# Patient Record
Sex: Female | Born: 1945 | Race: White | Hispanic: No | Marital: Married | Smoking: Former smoker
Health system: Southern US, Community
[De-identification: ages and names within clinical notes are randomized; demographics above are authoritative.]

## PROBLEM LIST (undated history)

## (undated) DIAGNOSIS — K219 Gastro-esophageal reflux disease without esophagitis: Secondary | ICD-10-CM

## (undated) DIAGNOSIS — C259 Malignant neoplasm of pancreas, unspecified: Secondary | ICD-10-CM

## (undated) DIAGNOSIS — Z856 Personal history of leukemia: Secondary | ICD-10-CM

## (undated) DIAGNOSIS — C9590 Leukemia, unspecified not having achieved remission: Secondary | ICD-10-CM

## (undated) DIAGNOSIS — K37 Unspecified appendicitis: Secondary | ICD-10-CM

## (undated) DIAGNOSIS — N390 Urinary tract infection, site not specified: Secondary | ICD-10-CM

## (undated) HISTORY — PX: FOOT SURGERY: SHX648

## (undated) HISTORY — PX: KNEE SURGERY: SHX244

## (undated) HISTORY — PX: HAND SURGERY: SHX662

## (undated) HISTORY — PX: APPENDECTOMY: SHX54

## (undated) HISTORY — DX: Personal history of leukemia: Z85.6

## (undated) HISTORY — DX: Gastro-esophageal reflux disease without esophagitis: K21.9

---

## 1998-06-03 ENCOUNTER — Other Ambulatory Visit: Admission: RE | Admit: 1998-06-03 | Discharge: 1998-06-03 | Payer: Self-pay | Admitting: Obstetrics and Gynecology

## 1998-12-23 ENCOUNTER — Encounter: Admission: RE | Admit: 1998-12-23 | Discharge: 1999-01-24 | Payer: Self-pay | Admitting: Family Medicine

## 1999-11-16 ENCOUNTER — Other Ambulatory Visit: Admission: RE | Admit: 1999-11-16 | Discharge: 1999-11-16 | Payer: Self-pay | Admitting: Obstetrics and Gynecology

## 1999-11-29 ENCOUNTER — Encounter: Admission: RE | Admit: 1999-11-29 | Discharge: 1999-11-29 | Payer: Self-pay | Admitting: Psychology

## 1999-11-29 ENCOUNTER — Encounter: Payer: Self-pay | Admitting: Obstetrics and Gynecology

## 2000-05-29 ENCOUNTER — Encounter: Admission: RE | Admit: 2000-05-29 | Discharge: 2000-05-29 | Payer: Self-pay | Admitting: Family Medicine

## 2000-05-29 ENCOUNTER — Encounter: Payer: Self-pay | Admitting: Family Medicine

## 2000-11-20 ENCOUNTER — Other Ambulatory Visit: Admission: RE | Admit: 2000-11-20 | Discharge: 2000-11-20 | Payer: Self-pay | Admitting: Obstetrics and Gynecology

## 2002-06-27 ENCOUNTER — Other Ambulatory Visit: Admission: RE | Admit: 2002-06-27 | Discharge: 2002-06-27 | Payer: Self-pay | Admitting: Obstetrics and Gynecology

## 2002-09-06 ENCOUNTER — Inpatient Hospital Stay (HOSPITAL_COMMUNITY): Admission: EM | Admit: 2002-09-06 | Discharge: 2002-09-08 | Payer: Self-pay | Admitting: *Deleted

## 2002-09-06 ENCOUNTER — Encounter: Payer: Self-pay | Admitting: Emergency Medicine

## 2002-09-07 ENCOUNTER — Encounter (INDEPENDENT_AMBULATORY_CARE_PROVIDER_SITE_OTHER): Payer: Self-pay

## 2002-11-27 ENCOUNTER — Encounter: Payer: Self-pay | Admitting: Family Medicine

## 2002-11-27 ENCOUNTER — Encounter: Admission: RE | Admit: 2002-11-27 | Discharge: 2002-11-27 | Payer: Self-pay | Admitting: Family Medicine

## 2003-07-22 ENCOUNTER — Other Ambulatory Visit: Admission: RE | Admit: 2003-07-22 | Discharge: 2003-07-22 | Payer: Self-pay | Admitting: Obstetrics and Gynecology

## 2004-03-04 ENCOUNTER — Encounter: Admission: RE | Admit: 2004-03-04 | Discharge: 2004-03-04 | Payer: Self-pay | Admitting: Family Medicine

## 2005-07-31 ENCOUNTER — Encounter: Admission: RE | Admit: 2005-07-31 | Discharge: 2005-07-31 | Payer: Self-pay | Admitting: Family Medicine

## 2008-07-06 ENCOUNTER — Encounter: Admission: RE | Admit: 2008-07-06 | Discharge: 2008-07-06 | Payer: Self-pay | Admitting: Orthopedic Surgery

## 2008-09-18 DIAGNOSIS — Z856 Personal history of leukemia: Secondary | ICD-10-CM

## 2008-09-18 HISTORY — DX: Personal history of leukemia: Z85.6

## 2009-10-19 ENCOUNTER — Encounter: Admission: RE | Admit: 2009-10-19 | Discharge: 2009-10-19 | Payer: Self-pay | Admitting: Internal Medicine

## 2010-03-30 ENCOUNTER — Encounter (INDEPENDENT_AMBULATORY_CARE_PROVIDER_SITE_OTHER): Payer: Self-pay | Admitting: *Deleted

## 2010-03-31 ENCOUNTER — Ambulatory Visit: Payer: Self-pay | Admitting: Gastroenterology

## 2010-04-04 ENCOUNTER — Telehealth: Payer: Self-pay | Admitting: Gastroenterology

## 2010-04-13 ENCOUNTER — Telehealth: Payer: Self-pay | Admitting: Gastroenterology

## 2010-04-14 ENCOUNTER — Ambulatory Visit: Payer: Self-pay | Admitting: Gastroenterology

## 2010-04-18 ENCOUNTER — Encounter: Payer: Self-pay | Admitting: Gastroenterology

## 2010-10-18 NOTE — Miscellaneous (Signed)
Summary: dir col ...em  Clinical Lists Changes  Medications: Added new medication of MOVIPREP 100 GM  SOLR (PEG-KCL-NACL-NASULF-NA ASC-C) As directed - Signed Rx of MOVIPREP 100 GM  SOLR (PEG-KCL-NACL-NASULF-NA ASC-C) As directed;  #1 x 0;  Signed;  Entered by: Clide Cliff RN;  Authorized by: Louis Meckel MD;  Method used: Electronically to [facility] [phone]*, [address] [place], [place], [place]  [address], Ph: [phone], Fax: [phone] Observations: Added new observation of ALLERGY REV: Done (03/31/2010 11:16)    Prescriptions: MOVIPREP 100 GM  SOLR (PEG-KCL-NACL-NASULF-NA ASC-C) As directed  #1 x 0   Entered by:   Clide Cliff RN   Authorized by:   Louis Meckel MD   Signed by:   Clide Cliff RN on 03/31/2010   Method used:   Electronically to        [facility] [phone]* (retail)       [address]       [place], [place]  [address]       Ph: [phone]       Fax: [phone]   RxID:   [id number]

## 2010-10-18 NOTE — Progress Notes (Signed)
Summary: prep ?  Phone Note Call from Patient Call back at [facility] Phone [phone]   Caller: Patient Call For: Dr. Arlyce Dice Reason for Call: Talk to Nurse Summary of Call: prep ? Initial call taken by: Vallarie Mare,  April 13, 2010 8:53 AM  Follow-up for Phone Call        Pt calling asking can she start her prep sooner than 5 pm and also can she have non dairy creamer........ Follow-up by: Joylene John RN,  April 13, 2010 9:58 AM  Additional Follow-up for Phone Call Additional follow up Details #1::        Pt instructed she may start her prep 3-4 pm but not sooner and she can have 1 use of non dairy creamer if necessary but no more because is cloudy and needs to avoid cloudy liquids. Pt verbalized understanding of instructions given Additional Follow-up by: Joylene John RN,  April 13, 2010 9:59 AM

## 2010-10-18 NOTE — Letter (Signed)
Summary: Patient Notice-Hyperplastic Polyps  [facility]  [address] [place], [place] [address]   Phone: [phone]  Fax: [phone]        April 18, 2010 MRN: [id number]    SONI KEGEL [address] [place], [place]  [address]    Dear Ms. Suhre,  I am pleased to inform you that the colon polyp(s) removed during your recent colonoscopy was (were) found to be hyperplastic.  These types of polyps are NOT pre-cancerous.  It is  my recommendation that you have a repeat colonoscopy examination in _5 years in view of your family history of colon cancer.  Should you develop new or worsening symptoms of abdominal pain, bowel habit changes or bleeding from the rectum or bowels, please schedule an evaluation with either your primary care physician or with me.  Additional information/recommendations:  __No further action with gastroenterology is needed at this time.      Please follow-up with your primary care physician for your other      healthcare needs. __Please call [phone] to schedule a return visit to review      your situation.  __Please keep your follow-up visit as already scheduled.  _x_Continue treatment plan as outlined the day of your exam.  Please call us if you are having persistent problems or have questions about your condition that have not been fully answered at this time.  Sincerely,  Louis Meckel MD This letter has been electronically signed by your physician.  Appended Document: Patient Notice-Hyperplastic Polyps Letter mailed 8.3.2011

## 2010-10-18 NOTE — Procedures (Signed)
Summary: Colonoscopy  Patient: Sharon Newton Note: All result statuses are Final unless otherwise noted.  Tests: (1) Colonoscopy (COL)   COL Colonoscopy           DONE     [facility]     [address]     [place], [place]  [address]           COLONOSCOPY PROCEDURE REPORT           PATIENT:  Sharon Newton, Sharon Newton  MR#:  [id number]     BIRTHDATE:  [birth year], 64 yrs. old  GENDER:  female           ENDOSCOPIST:  Barbette Hair. Arlyce Dice, MD     Referred by:           PROCEDURE DATE:  04/14/2010     PROCEDURE:  Diagnostic Colonoscopy     ASA CLASS:  Class II     INDICATIONS:  1) Elevated Risk Screening  2) family history of     colon cancer Father, brother, paternal grandfather           MEDICATIONS:   Fentanyl 50 mcg IV, Versed 7 mg IV           DESCRIPTION OF PROCEDURE:   After the risks benefits and     alternatives of the procedure were thoroughly explained, informed     consent was obtained.  Digital rectal exam was performed and     revealed moderate external hemorrhoids.   The LB CF-H180AL [id number]     endoscope was introduced through the anus and advanced to the     cecum, which was identified by both the appendix and ileocecal     valve, without limitations.  The quality of the prep was     excellent, using MoviPrep.  The instrument was then slowly     withdrawn as the colon was fully examined.     <<PROCEDUREIMAGES>>           FINDINGS:  There were multiple polyps identified and removed. in     the rectum and sigmoid colon. Multiple 1-58mm sessile, hyperplastic     appearing polyps within 20cm of anus. 1 polyp was removed with     cold snare and a second with cold biopsy forceps (see image14).     Mild diverticulosis was found in the sigmoid colon (see image1).     Internal and external hemorrhoids were found (see image15).  This     was otherwise a normal examination of the colon (see image2,     image3, image5, image6, image8, image10, and image12).     Retroflexed views in  the rectum revealed no abnormalities.    The     time to cecum =  8.0  minutes. The scope was then withdrawn (time     =  7.5  min) from the patient and the procedure completed.           COMPLICATIONS:  None           ENDOSCOPIC IMPRESSION:     1) Polyps, multiple hyperplastic appearing in the rectum and     sigmoid colon     2) Mild diverticulosis in the sigmoid colon     3) Internal and external hemorrhoids     4) Otherwise normal examination     RECOMMENDATIONS:     1) Given your significant family history of colon cancer, you     should have a repeat colonoscopy  in 5 years           REPEAT EXAM:  In 5 year(s) for Colonoscopy.           ______________________________     Barbette Hair. Arlyce Dice, MD           CC: Orvan July MD           n.     Rosalie Doctor:   Barbette Hair. Kruz Chiu at 04/14/2010 08:50 AM           Page 2 of 3   [place], [place], [id number]  Note: An exclamation mark (!) indicates a result that was not dispersed into the flowsheet. Document Creation Date: 04/14/2010 8:52 AM _______________________________________________________________________  (1) Order result status: Final Collection or observation date-time: 04/14/2010 08:40 Requested date-time:  Receipt date-time:  Reported date-time:  Referring Physician:   Ordering Physician: Melvia Heaps [phone]) Specimen Source:  Source: Launa Grill Order Number: [id number] Lab site:   Appended Document: Colonoscopy     Procedures Next Due Date:    Colonoscopy: 03/2015

## 2010-10-18 NOTE — Progress Notes (Signed)
Summary: prep ?'s  Phone Note Call from Patient Call back at [facility] Phone [phone]   Caller: Patient Call For: Dr. Arlyce Dice Reason for Call: Talk to Nurse Summary of Call: prep ?'s Initial call taken by: Vallarie Mare,  April 04, 2010 3:42 PM  Follow-up for Phone Call        Pt moved appt. time to 8:00; prep times verified and adjusted as needed.  No further questions Follow-up by: Karl Bales RN,  April 04, 2010 5:20 PM

## 2010-10-18 NOTE — Letter (Signed)
Summary: [facility] Instructions  [facility]  [address] [place], [place] [address]   Phone: [phone]  Fax: [phone]       Sharon Newton    11-06-1945    MRN: [id number]        Procedure Day Dorna Bloom:  Lenor Coffin  04/14/10     Arrival Time:  10:00AM      Procedure Time:  11:00AM     Location of Procedure:                    _X _  [facility] (4th Floor)                       PREPARATION FOR COLONOSCOPY WITH MOVIPREP   Starting 5 days prior to your procedure 04/09/10 do not eat nuts, seeds, popcorn, corn, beans, peas,  salads, or any raw vegetables.  Do not take any fiber supplements (e.g. Metamucil, Citrucel, and Benefiber).  THE DAY BEFORE YOUR PROCEDURE         DATE: 04/13/10  DAY: WEDNESDAY  1.  Drink clear liquids the entire day-NO SOLID FOOD  2.  Do not drink anything colored red or purple.  Avoid juices with pulp.  No orange juice.  3.  Drink at least 64 oz. (8 glasses) of fluid/clear liquids during the day to prevent dehydration and help the prep work efficiently.  CLEAR LIQUIDS INCLUDE: Water Jello Ice Popsicles Tea (sugar ok, no milk/cream) Powdered fruit flavored drinks Coffee (sugar ok, no milk/cream) Gatorade Juice: apple, white grape, white cranberry  Lemonade Clear bullion, consomm, broth Carbonated beverages (any kind) Strained chicken noodle soup Hard Candy                             4.  In the morning, mix first dose of MoviPrep solution:    Empty 1 Pouch A and 1 Pouch B into the disposable container    Add lukewarm drinking water to the top line of the container. Mix to dissolve    Refrigerate (mixed solution should be used within 24 hrs)  5.  Begin drinking the prep at 5:00 p.m. The MoviPrep container is divided by 4 marks.   Every 15 minutes drink the solution down to the next mark (approximately 8 oz) until the full liter is complete.   6.  Follow completed prep with 16 oz of clear liquid of your choice  (Nothing red or purple).  Continue to drink clear liquids until bedtime.  7.  Before going to bed, mix second dose of MoviPrep solution:    Empty 1 Pouch A and 1 Pouch B into the disposable container    Add lukewarm drinking water to the top line of the container. Mix to dissolve    Refrigerate  THE DAY OF YOUR PROCEDURE      DATE: 04/14/10  DAY: THURSDAY  Beginning at 6:00AM (5 hours before procedure):         1. Every 15 minutes, drink the solution down to the next mark (approx 8 oz) until the full liter is complete.  2. Follow completed prep with 16 oz. of clear liquid of your choice.    3. You may drink clear liquids until 9:00AM (2 HOURS BEFORE PROCEDURE).   MEDICATION INSTRUCTIONS  Unless otherwise instructed, you should take regular prescription medications with a small sip of water   as early as possible the morning  of your procedure.         OTHER INSTRUCTIONS  You will need a responsible adult at least 63 years of age to accompany you and drive you home.   This person must remain in the waiting room during your procedure.  Wear loose fitting clothing that is easily removed.  Leave jewelry and other valuables at home.  However, you may wish to bring a book to read or  an iPod/MP3 player to listen to music as you wait for your procedure to start.  Remove all body piercing jewelry and leave at home.  Total time from sign-in until discharge is approximately 2-3 hours.  You should go home directly after your procedure and rest.  You can resume normal activities the  day after your procedure.  The day of your procedure you should not:   Drive   Make legal decisions   Operate machinery   Drink alcohol   Return to work  You will receive specific instructions about eating, activities and medications before you leave.    The above instructions have been reviewed and explained to me by   Clide Cliff, RN_______________________    I fully understand  and can verbalize these instructions _____________________________ Date _________

## 2010-11-04 ENCOUNTER — Other Ambulatory Visit: Payer: Self-pay | Admitting: *Deleted

## 2010-11-04 DIAGNOSIS — Z1231 Encounter for screening mammogram for malignant neoplasm of breast: Secondary | ICD-10-CM

## 2010-11-16 ENCOUNTER — Ambulatory Visit
Admission: RE | Admit: 2010-11-16 | Discharge: 2010-11-16 | Disposition: A | Discharge status: Home / Self Care | Payer: BC Managed Care – PPO | Source: Ambulatory Visit | Attending: *Deleted | Admitting: *Deleted

## 2010-11-16 DIAGNOSIS — Z1231 Encounter for screening mammogram for malignant neoplasm of breast: Secondary | ICD-10-CM

## 2011-02-03 NOTE — Op Note (Signed)
NAME:  Sharon Newton, Sharon Newton                           ACCOUNT NO.:  [id number]   MEDICAL RECORD NO.:  [id number]                   PATIENT TYPE:  INP   LOCATION:  0453                                 FACILITY:  [facility]   PHYSICIAN:  Lorre Munroe., M.D.            DATE OF BIRTH:  18-Nov-1945   DATE OF PROCEDURE:  09/07/2002  DATE OF DISCHARGE:                                 OPERATIVE REPORT   PREOPERATIVE DIAGNOSES:  Acute appendicitis.   POSTOPERATIVE DIAGNOSES:  Acute appendicitis.   OPERATION PERFORMED:  Laparoscopic appendectomy.   SURGEON:  Lebron Conners, M.D.   ANESTHESIA:  General.   PROCEDURE:  After the patient was monitored and anesthetized and had routine  preparation and draping of the abdomen and a Foley catheter inserted, I  anesthestized three port sites, one in the hypogastrium, one just below the  umbilicus, and one in the right upper quadrant.  I made a short  infraumbilical incision and opened the fascia longitudinally and opened the  peritoneum bluntly and then made sure there were no adhesions to the  anterior abdominal wall in that region and secured with Hasson cannula with  a pursestring 0 Vicryl suture in the fascia.  After inflating the abdomen  with CO2, I put in two additional ports under direct vision with a large one  in the lower midline and a 5 mm port in the right upper quadrant.  I then  retracted the cecum up and could see that there was evidence of acute  inflammation at the tip and medial to it.  I bluntly dissected this up and  discovered an acutely inflamed appendix with what appeared to be perhaps a  small gangrenous portion in the midportion of it, but it was not ruptured.  I grasped it with a large grasper and elevated it and dissected through the  mesoappendix, gathering hemostasis with the cautery and thoroughly  cauterizing the appendiceal artery.  I dissected until I got right down to  the cecum, and there was healthy-appearing  appendix adjacent to the cecum.  I then stapled across it with the endoscopic cutting stapler, and it came  off quite nicely with one firing of the stapler and appeared to be quite  secure.  I then irrigated the operative area and removed irrigant and  assured that hemostasis was good.  I removed the appendix from the body in a  plastic pouch and then tied the pursestring suture.  I again checked for  hemostasis and then removed the lateral port under direct vision and then  removed from the lower midline port after first evacuating the CO2.  I  closed all skin incisions with intracuticular 4-0 Vicryl and Steri-Strips.  The patient tolerated the operation well.  Lorre Munroe., M.D.   WB/MEDQ  D:  09/07/2002  T:  09/08/2002  Job:  [id number]   cc:   Brett Canales A. Cleta Alberts, M.D.  [address]  [place]  [place] [address]  Fax: [phone]

## 2011-08-02 ENCOUNTER — Encounter (INDEPENDENT_AMBULATORY_CARE_PROVIDER_SITE_OTHER): Payer: BC Managed Care – PPO | Admitting: Surgery

## 2011-08-04 ENCOUNTER — Ambulatory Visit (INDEPENDENT_AMBULATORY_CARE_PROVIDER_SITE_OTHER): Payer: BC Managed Care – PPO | Admitting: Surgery

## 2011-08-17 ENCOUNTER — Encounter (INDEPENDENT_AMBULATORY_CARE_PROVIDER_SITE_OTHER): Payer: BC Managed Care – PPO | Admitting: Surgery

## 2011-08-21 ENCOUNTER — Other Ambulatory Visit: Payer: Self-pay | Admitting: Orthopaedic Surgery

## 2011-08-21 DIAGNOSIS — M79669 Pain in unspecified lower leg: Secondary | ICD-10-CM

## 2011-08-22 ENCOUNTER — Ambulatory Visit
Admission: RE | Admit: 2011-08-22 | Discharge: 2011-08-22 | Disposition: A | Discharge status: Home / Self Care | Payer: BC Managed Care – PPO | Source: Ambulatory Visit | Attending: Orthopaedic Surgery | Admitting: Orthopaedic Surgery

## 2011-08-22 ENCOUNTER — Other Ambulatory Visit: Payer: BC Managed Care – PPO

## 2011-08-22 DIAGNOSIS — M79669 Pain in unspecified lower leg: Secondary | ICD-10-CM

## 2011-09-26 ENCOUNTER — Ambulatory Visit: Payer: BC Managed Care – PPO | Attending: Orthopaedic Surgery | Admitting: Physical Therapy

## 2011-09-26 DIAGNOSIS — M25569 Pain in unspecified knee: Secondary | ICD-10-CM | POA: Insufficient documentation

## 2011-09-26 DIAGNOSIS — IMO0001 Reserved for inherently not codable concepts without codable children: Secondary | ICD-10-CM | POA: Insufficient documentation

## 2011-10-11 ENCOUNTER — Other Ambulatory Visit: Payer: Self-pay | Admitting: Internal Medicine

## 2011-10-11 DIAGNOSIS — Z1231 Encounter for screening mammogram for malignant neoplasm of breast: Secondary | ICD-10-CM

## 2011-11-17 ENCOUNTER — Ambulatory Visit
Admission: RE | Admit: 2011-11-17 | Discharge: 2011-11-17 | Disposition: A | Discharge status: Home / Self Care | Payer: BC Managed Care – PPO | Source: Ambulatory Visit | Attending: Internal Medicine | Admitting: Internal Medicine

## 2011-11-17 DIAGNOSIS — Z1231 Encounter for screening mammogram for malignant neoplasm of breast: Secondary | ICD-10-CM

## 2013-01-20 ENCOUNTER — Other Ambulatory Visit: Payer: Self-pay

## 2013-01-20 DIAGNOSIS — Z1231 Encounter for screening mammogram for malignant neoplasm of breast: Secondary | ICD-10-CM

## 2013-02-26 ENCOUNTER — Ambulatory Visit
Admission: RE | Admit: 2013-02-26 | Discharge: 2013-02-26 | Disposition: A | Discharge status: Home / Self Care | Payer: Medicare Other | Source: Ambulatory Visit

## 2013-02-26 DIAGNOSIS — Z1231 Encounter for screening mammogram for malignant neoplasm of breast: Secondary | ICD-10-CM

## 2014-02-03 ENCOUNTER — Encounter: Payer: Self-pay | Admitting: Podiatry

## 2014-02-03 ENCOUNTER — Ambulatory Visit (INDEPENDENT_AMBULATORY_CARE_PROVIDER_SITE_OTHER): Payer: Medicare Other | Admitting: Podiatry

## 2014-02-03 ENCOUNTER — Ambulatory Visit (INDEPENDENT_AMBULATORY_CARE_PROVIDER_SITE_OTHER): Payer: Medicare Other

## 2014-02-03 VITALS — BP 114/63 | HR 75 | Resp 16 | Ht 63.0 in | Wt 131.0 lb

## 2014-02-03 DIAGNOSIS — M21619 Bunion of unspecified foot: Secondary | ICD-10-CM

## 2014-02-03 DIAGNOSIS — M2020 Hallux rigidus, unspecified foot: Secondary | ICD-10-CM

## 2014-02-03 NOTE — Patient Instructions (Signed)
Pre-Operative Instructions  Congratulations, you have decided to take an important step to improving your quality of life.  You can be assured that the doctors of [facility] will be with you every step of the way.  1. Plan to be at the surgery center/hospital at least 1 (one) hour prior to your scheduled time unless otherwise directed by the surgical center/hospital staff.  You must have a responsible adult accompany you, remain during the surgery and drive you home.  Make sure you have directions to the surgical center/hospital and know how to get there on time. 2. For hospital based surgery you will need to obtain a history and physical form from your family physician within 1 month prior to the date of surgery- we will give you a form for you primary physician.  3. We make every effort to accommodate the date you request for surgery.  There are however, times where surgery dates or times have to be moved.  We will contact you as soon as possible if a change in schedule is required.   4. No Aspirin/Ibuprofen for one week before surgery.  If you are on aspirin, any non-steroidal anti-inflammatory medications (Mobic, Aleve, Ibuprofen) you should stop taking it 7 days prior to your surgery.  You make take Tylenol  For pain prior to surgery.  5. Medications- If you are taking daily heart and blood pressure medications, seizure, reflux, allergy, asthma, anxiety, pain or diabetes medications, make sure the surgery center/hospital is aware before the day of surgery so they may notify you which medications to take or avoid the day of surgery. 6. No food or drink after midnight the night before surgery unless directed otherwise by surgical center/hospital staff. 7. No alcoholic beverages 24 hours prior to surgery.  No smoking 24 hours prior to or 24 hours after surgery. 8. Wear loose pants or shorts- loose enough to fit over bandages, boots, and casts. 9. No slip on shoes, sneakers are best. 10. Bring  your boot with you to the surgery center/hospital.  Also bring crutches or a walker if your physician has prescribed it for you.  If you do not have this equipment, it will be provided for you after surgery. 11. If you have not been contracted by the surgery center/hospital by the day before your surgery, call to confirm the date and time of your surgery. 12. Leave-time from work may vary depending on the type of surgery you have.  Appropriate arrangements should be made prior to surgery with your employer. 13. Prescriptions will be provided immediately following surgery by your doctor.  Have these filled as soon as possible after surgery and take the medication as directed. 14. Remove nail polish on the operative foot. 15. Wash the night before surgery.  The night before surgery wash the foot and leg well with the antibacterial soap provided and water paying special attention to beneath the toenails and in between the toes.  Rinse thoroughly with water and dry well with a towel.  Perform this wash unless told not to do so by your physician.  Enclosed: 1 Ice pack (please put in freezer the night before surgery)   1 Hibiclens skin cleaner   Pre-op Instructions  If you have any questions regarding the instructions, do not hesitate to call our office.  [place]: [address] [place], [place] [address] [phone]  [place]: [address] [place], [place] [address] [phone]  [place]: [address]  , [place] [address] [phone]  Dr. Richard   Tuchman DPM, Dr. Norman Regal DPM Dr. Richard Sikora DPM, Dr. M. Todd Hyatt DPM, Dr. Kathryn Egerton DPM 

## 2014-02-03 NOTE — Progress Notes (Signed)
   Subjective:    Patient ID: Sharon Newton, female    DOB: [birth year], 68 y.o.   MRN: [id number]  HPI Comments: This right foot , it never has stopped hurting since surgery. Its sharp, throbbing , aching, sore . i just started to get the feeling back in it this year. It hurts at anytime , wearing flat shoes i am in a lot of trouble   Foot Pain      Review of Systems  All other systems reviewed and are negative.      Objective:   Physical Exam: I have reviewed her past medical history medications allergies surgeries social history and review of systems. Pulses are strongly palpable bilateral. Deep tendon reflexes are intact bilateral neurologic sensorium is intact per since once the monofilament. Muscle strength is 5 over 5 dorsiflexors plantar flexors inverters everters all intrinsic musculature appears to be intact orthopedic evaluation demonstrates all joints distal to the ankle a full range of motion with the exception of the first metatarsophalangeal joint of the right foot. This joint is status post bunion correction with Akin osteotomy which when on to degenerative joint disease postoperatively in a slightly dorsiflexed position resulting in chronic sesamoiditis and pain about the first metatarsophalangeal joint. She has no motion at the first metatarsophalangeal joint of the right foot. She has pain on palpation of the fibular sesamoid right first metatarsophalangeal joint plantarly. Radiographic evaluation confirms a rectus hallux with severe osteoarthritis and loss of joint space. Lateral view does demonstrate dorsiflexion of the first metatarsophalangeal joint. Cutaneous evaluation demonstrates supple well hydrated cutis without erythema edema cellulitis drainage or odor.        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 This is status post Austin bunion repair with an Akin  osteotomy.  Plan: We discussed the etiology pathology conservative versus surgical therapies. We discussed in great detail today the pros and cons of 2 procedures: #1 a fusion of the first metatarsophalangeal joint of the right foot, #2 Keller arthroplasty with single silicone implant. I answered all the questions regarding these procedures the best of my ability in layman's terms. I expressed to her that in under no uncertain terms that this may decrease or eliminate her pain. I made no promises that she would be pain free nor did I made any promises that this would even help at this point in time. She understands that she may need further surgery loss of sensation also digit or loss of life. She went over consent form line bylined number by number giving her ample time to ask questions she saw fit. These questions were answered by myself or our nursing staff. I will followup with her at the end of June for surgical procedure. Currently we are planning Keller arthroplasty with single silicone implant.

## 2014-03-05 ENCOUNTER — Other Ambulatory Visit: Payer: Self-pay | Admitting: Podiatry

## 2014-03-05 MED ORDER — CEPHALEXIN 500 MG PO CAPS: 500.0000 mg | ORAL_CAPSULE | Freq: Three times a day (TID) | ORAL | Status: DC

## 2014-03-05 MED ORDER — PROMETHAZINE HCL 12.5 MG PO TABS: 25.0000 mg | ORAL_TABLET | Freq: Four times a day (QID) | ORAL | Status: AC | PRN

## 2014-03-05 MED ORDER — MEPERIDINE HCL 50 MG PO TABS: ORAL_TABLET | ORAL | Status: DC

## 2014-03-06 ENCOUNTER — Telehealth: Payer: Self-pay | Admitting: *Deleted

## 2014-03-06 ENCOUNTER — Other Ambulatory Visit: Payer: Self-pay | Admitting: Podiatrist

## 2014-03-06 DIAGNOSIS — M2020 Hallux rigidus, unspecified foot: Secondary | ICD-10-CM

## 2014-03-06 NOTE — Telephone Encounter (Signed)
My wife was in this morning, had foot surgery.  The doctor wrote a prescription for Demerol.  The insurance company says no.  I assume he has to call them first.  When this shot wears off she's going to have pain.  We need this pretty quickly.

## 2014-03-06 NOTE — Telephone Encounter (Signed)
Per Dr. Valentina Lucks, I called and asked the husband if patient can tolerate Dilaudid or Hydrocodone.  He stated he went ahead and paid for the medicine.  He said he doesn't think it makes since to have to wait 72 hours to get the medicine when the patient needs it.  What do you think?  I told him I agree.  He said to thank Dr. Valentina Lucks for trying to take care of this.

## 2014-03-09 ENCOUNTER — Telehealth: Payer: Self-pay | Admitting: *Deleted

## 2014-03-09 NOTE — Telephone Encounter (Signed)
Just had foot surgery this morning.  He told my husband to bend my toe.  Dr. Milinda Pointer didn't tell him when to start.  My husband wants to start it immediately!  Help please!  I left her a message to go ahead and start the toe exercises.  Call if you have any further questions.

## 2014-03-10 ENCOUNTER — Telehealth: Payer: Self-pay

## 2014-03-10 NOTE — Telephone Encounter (Signed)
Spoke with pt regarding post op status. She stated that she is doing well and pain is very minimal. Advised her to remain in boot and sterile dressing until seen at her appt. Advised to elevate and take pain medications as needed.

## 2014-03-12 ENCOUNTER — Ambulatory Visit (INDEPENDENT_AMBULATORY_CARE_PROVIDER_SITE_OTHER): Payer: Medicare Other | Admitting: Podiatry

## 2014-03-12 ENCOUNTER — Ambulatory Visit (INDEPENDENT_AMBULATORY_CARE_PROVIDER_SITE_OTHER): Payer: Medicare Other

## 2014-03-12 ENCOUNTER — Encounter: Payer: Self-pay | Admitting: Podiatry

## 2014-03-12 VITALS — BP 125/97 | HR 76 | Temp 97.9°F | Resp 16

## 2014-03-12 DIAGNOSIS — M21619 Bunion of unspecified foot: Secondary | ICD-10-CM

## 2014-03-12 DIAGNOSIS — M21611 Bunion of right foot: Secondary | ICD-10-CM

## 2014-03-12 DIAGNOSIS — Z9889 Other specified postprocedural states: Secondary | ICD-10-CM

## 2014-03-12 MED ORDER — HYDROCODONE-ACETAMINOPHEN 5-325 MG PO TABS: ORAL_TABLET | ORAL | Status: DC

## 2014-03-12 NOTE — Progress Notes (Signed)
She presents today one week status post arthroplasty Keller arthroplasty to the first metatarsophalangeal joint of the right foot. She denies fever chills nausea vomiting muscle aches or pains.  Objective: Vital signs are stable she is alert and oriented x3 dry sterile dressing was removed demonstrates erythema and edema with him some blistering along the incision site but appears to be healing quite nicely she's got a good range of motion first metatarsophalangeal joint and radiographs confirm good position of the implant.  Assessment: Well-healing surgical foot status post 1 week and arthroplasty right foot.  Plan: Redressed today dressed a compressive dressing followup with her in one week.

## 2014-03-16 ENCOUNTER — Telehealth: Payer: Self-pay | Admitting: *Deleted

## 2014-03-16 NOTE — Telephone Encounter (Signed)
Had foot surgery on the 19th.  I came in last Thursday and had bandage changed.  i took my boot off on Sunday, the entire bandage packet came off.  Do you guys need to put a new bandage on it or trust me to do it?  I put one back on.  I called and informed her it should be okay since she has redressed it.  We will see her on Thursday for scheduled follow-up appointment.

## 2014-03-19 ENCOUNTER — Encounter: Payer: Self-pay | Admitting: Podiatry

## 2014-03-19 ENCOUNTER — Ambulatory Visit (INDEPENDENT_AMBULATORY_CARE_PROVIDER_SITE_OTHER): Payer: Medicare Other | Admitting: Podiatry

## 2014-03-19 VITALS — BP 122/68 | HR 84 | Resp 12

## 2014-03-19 DIAGNOSIS — Z9889 Other specified postprocedural states: Secondary | ICD-10-CM

## 2014-03-19 MED ORDER — CEPHALEXIN 500 MG PO CAPS: 500.0000 mg | ORAL_CAPSULE | Freq: Three times a day (TID) | ORAL | Status: DC

## 2014-03-19 NOTE — Progress Notes (Signed)
She presents today for her second week postop visit right foot. She denies fever chills nausea vomiting muscle aches and pains states it seems to be doing pretty well. She states she's been doing her exercises on a regular basis.  Objective: Dry sterile dressing was removed demonstrates some drainage on the dressing from the incision site. She still has some erythema and some friable tissue at the incision site. This is secondary to blistering and ulceration. This does not appear to be clinically infected at this time however I think another round of antibiotics may be necessary.  Assessment: Well-healing first metatarsophalangeal joint arthroplasty with single silicone implant . Some mild skin breakdown is present.  Plan: I encouraged range of motion exercises today I redressed the foot with a dry sterile compressive dressing and Betadine soaked gauze. I will followup with her in one week at which time I hope to start soaking in Epsom salts in warm water. I did dispense a Darco shoe today.

## 2014-03-26 ENCOUNTER — Ambulatory Visit (INDEPENDENT_AMBULATORY_CARE_PROVIDER_SITE_OTHER): Payer: Medicare Other | Admitting: Podiatry

## 2014-03-26 ENCOUNTER — Encounter: Payer: Self-pay | Admitting: Podiatry

## 2014-03-26 VITALS — BP 114/63 | HR 75 | Resp 16

## 2014-03-26 DIAGNOSIS — Z9889 Other specified postprocedural states: Secondary | ICD-10-CM

## 2014-03-26 NOTE — Progress Notes (Signed)
She presents today 3 weeks status post Keller arthroplasty with single silicone implant right foot. Over the past couple of weeks the wound has been somewhat boggy and wet on the top of the incision site. Since she was placed in a Darco shoe she states that the joint is become more sore. She denies fever chills nausea vomiting muscle aches and pains.  Objective: Vital signs are stable she is alert and oriented x3. The toe is stiff and painful on range of motion of the first metatarsophalangeal joint right foot more than likely secondary to the edema and superficial cellulitis that she has overlying the proximal phalanx of the hallux. There is mild dehiscence of the wound but is very superficial and does not probe to subcutaneous tissue.  Assessment: Mild postop infection about the first metatarsophalangeal joint of the right foot status post Keller arthroplasty single silicone implant. No systemic symptoms noted.  Plan: Discussed etiology pathology conservative versus surgical therapies. I encouraged her to use her Darco shoe. I encouraged range of motion exercises both active and passive range of motion. We started her on an oral antibiotic today and she will start soaking in Epsom salts and water after washing with an antibacterial soap. She'll apply a light dressing to the area to prevent drainage onto her shoe and debris in the dehisced surgical site. I will followup with her in one week.

## 2014-04-02 ENCOUNTER — Encounter: Payer: Self-pay | Admitting: Podiatry

## 2014-04-02 ENCOUNTER — Ambulatory Visit (INDEPENDENT_AMBULATORY_CARE_PROVIDER_SITE_OTHER): Payer: Medicare Other | Admitting: Podiatry

## 2014-04-02 ENCOUNTER — Ambulatory Visit (INDEPENDENT_AMBULATORY_CARE_PROVIDER_SITE_OTHER): Payer: Medicare Other

## 2014-04-02 DIAGNOSIS — M21611 Bunion of right foot: Secondary | ICD-10-CM

## 2014-04-02 DIAGNOSIS — Z9889 Other specified postprocedural states: Secondary | ICD-10-CM

## 2014-04-02 DIAGNOSIS — M21619 Bunion of unspecified foot: Secondary | ICD-10-CM

## 2014-04-02 MED ORDER — HYDROCODONE-ACETAMINOPHEN 5-325 MG PO TABS: ORAL_TABLET | ORAL | Status: DC

## 2014-04-02 NOTE — Progress Notes (Signed)
Sharon Newton presents today approximately one month status post Sharon Newton arthroplasty and single silicone implant right foot. We have had some delays with dehiscence of the wound and swelling. Currently she denies fever chills nausea vomiting muscle aches or pains other than pain at night while she trying to sleep. She and her husband are meticulously exercising the first metatarsophalangeal joint as I have requested. She states it is not and hurt nearly as bad as it has in the past.   Objective: Vital signs are stable she is alert and oriented x3. She has a very small superficial ulcerative lesion near the knot of the distal most portion of the incision dorsally. This does not appear to be clinically infected. She has a very good range of motion of the first metatarsophalangeal joint. Radiographically however it does demonstrate considerable soft tissue swelling which is actually scar tissue around the first metatarsophalangeal joint right foot.  Assessment: Well-healing surgical foot with the exception of a superficial wound to the dorsal aspect of the hallux.  Plan: Discussed etiology pathology conservative versus surgical therapies. We are going to continue current therapies for the wound. And she will continue massage therapy as well as range of motion exercises for that right foot. By the next him I see her we should be able to be into a pair of loosefitting tennis shoes or a mule type shoe. Followup with her in 2-4 weeks.

## 2014-04-16 ENCOUNTER — Ambulatory Visit (INDEPENDENT_AMBULATORY_CARE_PROVIDER_SITE_OTHER): Payer: Medicare Other

## 2014-04-16 ENCOUNTER — Encounter: Payer: Self-pay | Admitting: Podiatry

## 2014-04-16 ENCOUNTER — Ambulatory Visit (INDEPENDENT_AMBULATORY_CARE_PROVIDER_SITE_OTHER): Payer: Medicare Other | Admitting: Podiatry

## 2014-04-16 DIAGNOSIS — M21611 Bunion of right foot: Secondary | ICD-10-CM

## 2014-04-16 DIAGNOSIS — M21619 Bunion of unspecified foot: Secondary | ICD-10-CM

## 2014-04-16 DIAGNOSIS — Z9889 Other specified postprocedural states: Secondary | ICD-10-CM

## 2014-04-16 NOTE — Progress Notes (Signed)
She presents today more than 1 month status post Keller arthroplasty and single silicone implant. She states it seems to be doing pretty well. She denies fever chills nausea vomiting muscle aches and pains.  Objective: Vital signs are stable she is alert and oriented x3. Pulses remain palpable right foot. She has a acceptable range of motion to the first metatarsophalangeal joint of the right foot. The skin is still drawn tight with scar tissue and edema. Radiographic evaluation demonstrates a well-placed Keller arthroplasty and single silicone implant. No signs of cutaneous infection. She still has a very mild superficial wound to the distal most incision site. This does not appear to be clinically infected.  Assessment: Well-healing surgical foot right.  Plan: Increase range of motion exercises and get back to regular shoe gear she will continue to soak her foot daily and apply a dressing to the toe during the day and leave open at night.

## 2014-05-14 ENCOUNTER — Ambulatory Visit (INDEPENDENT_AMBULATORY_CARE_PROVIDER_SITE_OTHER): Payer: Medicare Other | Admitting: Podiatry

## 2014-05-14 ENCOUNTER — Ambulatory Visit (INDEPENDENT_AMBULATORY_CARE_PROVIDER_SITE_OTHER): Payer: Medicare Other

## 2014-05-14 ENCOUNTER — Encounter: Payer: Self-pay | Admitting: Podiatry

## 2014-05-14 VITALS — BP 124/69 | HR 69 | Resp 12

## 2014-05-14 DIAGNOSIS — M2011 Hallux valgus (acquired), right foot: Secondary | ICD-10-CM

## 2014-05-14 DIAGNOSIS — M2010 Hallux valgus (acquired), unspecified foot: Secondary | ICD-10-CM

## 2014-05-14 DIAGNOSIS — Z9889 Other specified postprocedural states: Secondary | ICD-10-CM

## 2014-05-14 NOTE — Progress Notes (Signed)
She presents today for followup of her Sharon Newton arthroplasty first metatarsophalangeal joint of the right foot. She states it seems to be doing much better.  Objective: Mild erythema about the hallux right. Mild postinflammatory hyperpigmentation. Mild edema. Great range of motion of the first metatarsophalangeal joint no pain on range of motion. Radiographic evaluation shows well-healing surgical foot.  Assessment: Well-healing Keller arthroplasty with single silicone implant right foot.  Plan: She may get back to her regular activities and I will followup with her in 2 months.

## 2014-06-01 ENCOUNTER — Other Ambulatory Visit: Payer: Self-pay | Admitting: Dermatology

## 2014-07-14 ENCOUNTER — Ambulatory Visit (INDEPENDENT_AMBULATORY_CARE_PROVIDER_SITE_OTHER): Payer: Medicare Other

## 2014-07-14 ENCOUNTER — Ambulatory Visit (INDEPENDENT_AMBULATORY_CARE_PROVIDER_SITE_OTHER): Payer: Medicare Other | Admitting: Podiatry

## 2014-07-14 ENCOUNTER — Encounter: Payer: Self-pay | Admitting: Podiatry

## 2014-07-14 VITALS — BP 116/58 | HR 73 | Resp 16

## 2014-07-14 DIAGNOSIS — M2011 Hallux valgus (acquired), right foot: Secondary | ICD-10-CM

## 2014-07-14 DIAGNOSIS — Z9889 Other specified postprocedural states: Secondary | ICD-10-CM

## 2014-07-14 NOTE — Progress Notes (Signed)
She presents today for a postop visit from her Jake Michaelis bunion repair right foot. States that he seems to be doing quite well with some soreness on the right lateral aspect.  Objective: She has a great range of motion of the first metatarsophalangeal joint mild tenderness on palpation of the tibial sesamoid. Radiographic evaluation demonstrates mild elevated hallux but the implant appears to be doing quite well.  Assessment: Status post Keller arthroplasty seems silicone implant left foot.  Plan: I dispensed a carbon graphite insole for her right shoe discussed appropriate shoe gear stretching exercises ice therapy will follow up with her on an as-needed basis.

## 2014-08-19 ENCOUNTER — Other Ambulatory Visit: Payer: Self-pay | Admitting: Obstetrics and Gynecology

## 2014-08-19 DIAGNOSIS — N644 Mastodynia: Secondary | ICD-10-CM

## 2014-10-16 ENCOUNTER — Other Ambulatory Visit: Payer: Self-pay | Admitting: Obstetrics and Gynecology

## 2014-10-16 DIAGNOSIS — N644 Mastodynia: Secondary | ICD-10-CM

## 2014-10-23 ENCOUNTER — Ambulatory Visit
Admission: RE | Admit: 2014-10-23 | Discharge: 2014-10-23 | Disposition: A | Discharge status: Home / Self Care | Payer: Medicare Other | Source: Ambulatory Visit | Attending: Obstetrics and Gynecology | Admitting: Obstetrics and Gynecology

## 2014-10-23 DIAGNOSIS — N644 Mastodynia: Secondary | ICD-10-CM

## 2015-01-21 ENCOUNTER — Encounter: Payer: Self-pay | Admitting: Gastroenterology

## 2015-04-09 ENCOUNTER — Encounter: Payer: Self-pay | Admitting: Gastroenterology

## 2015-04-20 ENCOUNTER — Encounter: Payer: Self-pay | Admitting: Physical Therapy

## 2015-04-20 ENCOUNTER — Ambulatory Visit: Payer: Medicare Other | Attending: Neurosurgery | Admitting: Physical Therapy

## 2015-04-20 DIAGNOSIS — M545 Low back pain, unspecified: Secondary | ICD-10-CM

## 2015-04-20 NOTE — Therapy (Signed)
[facility] [address]  [place], [place], [address] Phone: [phone]   Fax:  [phone]  Physical Therapy Evaluation  Patient Details  Name: Sharon Newton MRN: [id number] Date of Birth: 1946-01-11 Referring Provider:  Leeroy Cha, MD  Encounter Date: 04/20/2015      PT End of Session - 04/20/15 1514    Visit Number 1   Number of Visits 12   Date for PT Re-Evaluation 06/01/15   PT Start Time 6962   PT Stop Time 1550   PT Time Calculation (min) 58 min      Past Medical History  Diagnosis Date  . GERD (gastroesophageal reflux disease)   . Personal history of CLL (chronic lymphocytic leukemia) 2010    symptom free since chemo 4 years ago    Past Surgical History  Procedure Laterality Date  . Foot surgery    . Hand surgery      There were no vitals filed for this visit.  Visit Diagnosis:  Bilateral low back pain without sciatica - Plan: PT plan of care cert/re-cert      Subjective Assessment - 04/20/15 1502    Subjective Pt with LBP on/off over the years but states frequency and intensity have increased over the past few months. Denies N/T.   How long can you stand comfortably? 15-20 minutes (less pain if remains active while standing).   Diagnostic tests Pt with scoliosis and recent x-ray noted dextroscoliosis with apex at L2 measuring 24 degrees.   Currently in Pain? Yes   Pain Score --  Best 1/10; 3-4/10 on AVG, and up to 5/10 at worst   Pain Location Back   Pain Orientation Lower   Pain Descriptors / Indicators Aching;Sharp   Pain Frequency Constant   Aggravating Factors  prolonged standing, lifting, bending over, L side-lying   Pain Relieving Factors heat, rest            [facility] PT Assessment - 04/20/15 0001    Assessment   Medical Diagnosis LBP   Onset Date/Surgical Date 01/17/15   Balance Screen   Has the patient fallen in the past 6 months No   Has the patient had a decrease  in activity level because of a fear of falling?  No   Is the patient reluctant to leave their home because of a fear of falling?  No   Prior Function   Leisure enjoys cooking but has increased pain at times with this; walks 40 minutes 3x/wk for exercise, participated in resistance training until past several months.  Enjoys yardwork but limited in ability due to LBP and B hand OA.   Observation/Other Assessments   Focus on Therapeutic Outcomes (FOTO)  37% limitation   Functional Tests   Functional tests Squat   Posture/Postural Control   Posture Comments scoliosis with Aprex L2, R pelvic innominant slight anterior vs L supine more pronounced in standing   ROM / Strength   AROM / PROM / Strength Strength   AROM   AROM Assessment Site Lumbar   Lumbar Flexion hands to feet no pain   Lumbar Extension WNL no pain   Strength   Strength Assessment Site Hip   Right/Left Hip Left   Right Hip Flexion 4+/5   Right Hip Extension 4/5   Right Hip External Rotation  --  4+/5   Right Hip Internal Rotation  --  5/5   Right Hip ABduction 5/5   Right Hip ADduction  5/5   Left Hip Flexion 5/5   Left Hip Extension 4/5   Left Hip External Rotation  --  4+/5   Left Hip Internal Rotation  --  4+/5   Left Hip ABduction 5/5   Left Hip ADduction 5/5   Palpation   Palpation comment TTP with increased tone noted L paraspinals in L2 area          TODAY'S TREATMENT Manual - TPR and STM L lumbar paraspinals due to pain and high tone.                      PT Long Term Goals - 2015-04-27 1805    PT LONG TERM GOAL #1   Title pt independent with HEP as necessary by 06/01/15   Status New   PT LONG TERM GOAL #2   Title pt able to stand as needed to cook etc without limitation by LBP by 06/01/15   Status New   PT LONG TERM GOAL #3   Title pt able to perform chores, ADLs, recreational activities without LBP greater than 2/10 by 06/01/15   PT LONG TERM GOAL #4   Title pt reports LBP  frequency decreases from constant to less than 50% of time by 06/01/15   Status New               Plan - 04/27/2015 1809    Clinical Impression Statement pt with c/o LBP on/off throughout her life but rarely enough to limit function up until past few months. X-rays indicate dextroscoliosis with apex at L2 measuring 24 degrees.  She states her pain varies between R and L lower back and is mostly noted with prolonged standing, lifting, frequent bending, and L side-lying.  Pt had been active most of her life but has decreased resistance training over the past several months (stopped prior to reported increased intensity of LBP) and has reduced amount of walking she does.  She displays normal B LE flexibility, good Lumbar AROM despite scoliosis, and B Hip and Knee MMT is 5/5 other than 4/5 to 4+/5 B hip Ext, ER, and IR (no pain with MMT).  Tenderness and increased tissue density noted to L lumbar paraspinals in area of apex (L2).  We will focus on lumbopelvic stability training along with manual and modalities PRN for pain control.   Pt will benefit from skilled therapeutic intervention in order to improve on the following deficits Pain;Decreased strength   Rehab Potential Good   PT Frequency 2x / week   PT Duration 6 weeks   PT Treatment/Interventions Therapeutic exercise;Manual techniques;Therapeutic activities;Dry needling;Taping;Patient/family education;Functional mobility training   PT Next Visit Plan lumbopelvic stablity as able; manual / modalities PRN; try taping to t/l spine   Consulted and Agree with Plan of Care Patient          G-Codes - 27-Apr-2015 1804    Functional Assessment Tool Used foto 37% limitation   Functional Limitation Mobility: Walking and moving around   Mobility: Walking and Moving Around Current Status [phone]) At least 20 percent but less than 40 percent impaired, limited or restricted   Mobility: Walking and Moving Around Goal Status [phone]) At least 1 percent but  less than 20 percent impaired, limited or restricted       Problem List There are no active problems to display for this patient.   Deosha Werden PT, OCS 04-27-15, 6:20 PM  [facility] [address]  [address]  [place], [place], [address] Phone: [phone]   Fax:  [phone]

## 2015-04-21 ENCOUNTER — Ambulatory Visit: Payer: Medicare Other | Admitting: Rehabilitation

## 2015-04-21 DIAGNOSIS — M545 Low back pain, unspecified: Secondary | ICD-10-CM

## 2015-04-21 NOTE — Therapy (Signed)
[facility] [address]  [place], [place], [address] Phone: [phone]   Fax:  [phone]  Physical Therapy Treatment  Patient Details  Name: Sharon Newton MRN: [id number] Date of Birth: 08/10/1946 Referring Provider:  Leeroy Cha, MD  Encounter Date: 04/21/2015      PT End of Session - 04/21/15 1546    Visit Number 2   Number of Visits 12   Date for PT Re-Evaluation 06/01/15   PT Start Time 1547  pt early   PT Stop Time 3875   PT Time Calculation (min) 52 min      Past Medical History  Diagnosis Date  . GERD (gastroesophageal reflux disease)   . Personal history of CLL (chronic lymphocytic leukemia) 2010    symptom free since chemo 4 years ago    Past Surgical History  Procedure Laterality Date  . Foot surgery    . Hand surgery      There were no vitals filed for this visit.  Visit Diagnosis:  Bilateral low back pain without sciatica      Subjective Assessment - 04/21/15 1548    Subjective Reports being sore today (was here yesterday for initial eval and PT began manual work).    Currently in Pain? Yes   Pain Score --  1-2/10   Pain Location Back   Pain Orientation Lower      TODAY'S TREATMENT TherEx - Nustep level 5x6' (UE/LE)  Stretch bilateral SKTC, Hamstring 3x20" Hooklying TrA Iso 10x5" Hooklying TrA + Ball Squeeze 10x5" Hooklying TrA + Hip Abduction 10x with Black TB  Bridges 10x3" Hooklying TrA + March 10x  Manual - TPR and STM L lumbar paraspinals due to pain and high tone.       PT Long Term Goals - 04/21/15 1550    PT LONG TERM GOAL #1   Title pt independent with HEP as necessary by 06/01/15   Status On-going   PT LONG TERM GOAL #2   Title pt able to stand as needed to cook etc without limitation by LBP by 06/01/15   Status On-going   PT LONG TERM GOAL #3   Title pt able to perform chores, ADLs, recreational activities without LBP greater than 2/10 by 06/01/15   Status On-going   PT LONG TERM GOAL #4   Title pt reports LBP frequency decreases from constant to less than 50% of time by 06/01/15   Status On-going               Plan - 04/21/15 1645    Clinical Impression Statement Good tolerance to exercise without complaint of pain. Still high tone noted with Lt lumbar paraspinals with pt is able to tolerate manual work very well. Did not attempt taping today but may try at upcoming appointments.    PT Next Visit Plan lumbopelvic stablity as able; manual / modalities PRN; try taping to t/l spine   Consulted and Agree with Plan of Care Patient        Problem List There are no active problems to display for this patient.   [address], [place] 04/21/2015, 4:47 PM  [facility] [address]  [place], [place], [address] Phone: [phone]   Fax:  [phone]

## 2015-04-26 ENCOUNTER — Encounter: Payer: Self-pay | Admitting: Rehabilitation

## 2015-04-26 ENCOUNTER — Ambulatory Visit: Payer: Medicare Other | Admitting: Rehabilitation

## 2015-04-26 DIAGNOSIS — M545 Low back pain, unspecified: Secondary | ICD-10-CM

## 2015-04-26 NOTE — Therapy (Signed)
[facility] [address]  [place], [place], [address] Phone: [phone]   Fax:  [phone]  Physical Therapy Treatment  Patient Details  Name: Sharon Newton MRN: [id number] Date of Birth: [birth year] Referring Provider:  Leeroy Cha, MD  Encounter Date: 04/26/2015      PT End of Session - 04/26/15 1447    Visit Number 3   Number of Visits 12   Date for PT Re-Evaluation 06/01/15   PT Start Time 1400   PT Stop Time 1448   PT Time Calculation (min) 48 min   Activity Tolerance Patient tolerated treatment well      Past Medical History  Diagnosis Date  . GERD (gastroesophageal reflux disease)   . Personal history of CLL (chronic lymphocytic leukemia) 2010    symptom free since chemo 4 years ago    Past Surgical History  Procedure Laterality Date  . Foot surgery    . Hand surgery      There were no vitals filed for this visit.  Visit Diagnosis:  Bilateral low back pain without sciatica      Subjective Assessment - 04/26/15 1401    Subjective is only sore after manual work during therapy   Currently in Pain? Yes   Pain Score 2    Pain Location Back   Pain Orientation Left      TODAY'S TREATMENT TherEx - Nustep level 5x6' (UE/LE)  Stretch bilateral SKTC, Hamstring 3x20". LTR prolonged stretch x30" each Hooklying TrA Iso 10x5" with review of gentle contraction vs tightening the whole abdomen Hooklying TrA + Ball Squeeze 10x5" Hooklying TrA + Hip Abduction alternating 10x with red TB  Bridges 10x3" Hooklying TrA + bicycle legs 3x10" Played around with stretches with pt feeling stretch in child's pose with hands to the right and supine twist knees to the right with manual help (both added to HEP)  Manual - TPR and STM L lumbar paraspinals                                PT Long Term Goals - 04/21/15 1550    PT LONG TERM GOAL #1   Title pt independent with HEP as  necessary by 06/01/15   Status On-going   PT LONG TERM GOAL #2   Title pt able to stand as needed to cook etc without limitation by LBP by 06/01/15   Status On-going   PT LONG TERM GOAL #3   Title pt able to perform chores, ADLs, recreational activities without LBP greater than 2/10 by 06/01/15   Status On-going   PT LONG TERM GOAL #4   Title pt reports LBP frequency decreases from constant to less than 50% of time by 06/01/15   Status On-going               Plan - 04/26/15 1447    Clinical Impression Statement good tolerance.  had some increased pain with trA activation during more difficult activities (bicycle legs).  +2 ttp L QL and paraspinals.    PT Next Visit Plan lumbopelvic stablity as able; manual / modalities PRN; try taping to t/l spine;   update HEP to include her strengthening (pt asked at the end of the treatment)        Problem List There are no active problems to display for this patient.   Stark Bray, DPT, CMP 04/26/2015, 2:50 PM  [facility] [address]  [place], [place], [address] Phone: [phone]   Fax:  [phone]

## 2015-04-29 ENCOUNTER — Ambulatory Visit: Payer: Medicare Other | Admitting: Physical Therapy

## 2015-04-29 DIAGNOSIS — M545 Low back pain, unspecified: Secondary | ICD-10-CM

## 2015-04-29 NOTE — Therapy (Signed)
[facility] [address]  [place], [place], [address] Phone: [phone]   Fax:  [phone]  Physical Therapy Treatment  Patient Details  Name: Sharon Newton MRN: [id number] Date of Birth: [birth year] Referring Provider:  Leeroy Cha, MD  Encounter Date: 04/29/2015      PT End of Session - 04/29/15 1401    Visit Number 4   Number of Visits 12   Date for PT Re-Evaluation 06/01/15   PT Start Time 1400   PT Stop Time 2878   PT Time Calculation (min) 58 min      Past Medical History  Diagnosis Date  . GERD (gastroesophageal reflux disease)   . Personal history of CLL (chronic lymphocytic leukemia) 2010    symptom free since chemo 4 years ago    Past Surgical History  Procedure Laterality Date  . Foot surgery    . Hand surgery      There were no vitals filed for this visit.  Visit Diagnosis:  Bilateral low back pain without sciatica      Subjective Assessment - 04/29/15 1425    Subjective states was really sore following last treatment which lasted 2 days but is feeling much better today. States pain was up to 5/10 on Monday limiting ability to sleep without medication.   Currently in Pain? Yes   Pain Score 1    Pain Location Back      TODAY'S TREATMENT TherEx - Bridge on Heels 10x5" Piriformis Stretch DKTC stretch Unsupported LTR 10x3" Supine ALT SLR with TrA 10x each Hooklying Hip ABD Black TB 15x Seated Low row with mild UTR black TB 10x each  Manual - prone and R side-lying STM with stretching to L QL and paraspinals; Prone B LE pull for intermittent lumbar traction.                            PT Education - 04/29/15 1643    Education provided Yes   Education Details HEP Addition piri stretch and single hand low row (no HO for low row)   Person(s) Educated Patient   Methods Explanation;Demonstration;Handout   Comprehension Verbalized understanding;Returned  demonstration             PT Long Term Goals - 04/21/15 1550    PT LONG TERM GOAL #1   Title pt independent with HEP as necessary by 06/01/15   Status On-going   PT LONG TERM GOAL #2   Title pt able to stand as needed to cook etc without limitation by LBP by 06/01/15   Status On-going   PT LONG TERM GOAL #3   Title pt able to perform chores, ADLs, recreational activities without LBP greater than 2/10 by 06/01/15   Status On-going   PT LONG TERM GOAL #4   Title pt reports LBP frequency decreases from constant to less than 50% of time by 06/01/15   Status On-going               Plan - 04/29/15 1644    Clinical Impression Statement pt with increased pain following last session so mild decrease in intensity today.  Very well tolerated with no c/o pain increase during treatment.  Continued high tone and TTP L paraspinals and QL.   PT Next Visit Plan lumbopelvic stablity progressions; manual / modalities PRN; try taping to t/l spine   Consulted and Agree with Plan of Care Patient  Problem List There are no active problems to display for this patient.   [facility] PT, OCS 04/29/2015, 4:52 PM  [facility] [address]  [place], [place], [address] Phone: [phone]   Fax:  [phone]

## 2015-05-03 ENCOUNTER — Ambulatory Visit: Payer: Medicare Other | Admitting: Physical Therapy

## 2015-05-03 DIAGNOSIS — M545 Low back pain, unspecified: Secondary | ICD-10-CM

## 2015-05-03 NOTE — Therapy (Addendum)
[facility] [address]  [place], [place], [address] Phone: [phone]   Fax:  [phone]  Physical Therapy Treatment  Patient Details  Name: Sharon Newton MRN: [id number] Date of Birth: 08/09/1946 Referring Provider:  Leeroy Cha, MD  Encounter Date: 05/03/2015      PT End of Session - 05/03/15 1319    Visit Number 5   Number of Visits 12   Date for PT Re-Evaluation 06/01/15   PT Start Time 4128   PT Stop Time 1405   PT Time Calculation (min) 47 min      Past Medical History  Diagnosis Date  . GERD (gastroesophageal reflux disease)   . Personal history of CLL (chronic lymphocytic leukemia) 2010    symptom free since chemo 4 years ago    Past Surgical History  Procedure Laterality Date  . Foot surgery    . Hand surgery      There were no vitals filed for this visit.  Visit Diagnosis:  Bilateral low back pain without sciatica      Subjective Assessment - 05/03/15 1322    Subjective pt states she again noted increased pain following last treatment despite decreased intensity.  States pain lasted 3 days and was up to 5/10.  States pain down to 0-1/10 today.   Currently in Pain? Yes   Pain Score 1    Pain Location Back   Pain Orientation Left          TODAY'S TREATMENT TherEx - NuStep lvl 4, 3' Bridge 10x Partial Curl-up 15x Hooklying Hip ABD Blue TB 15x 3-way Prayer stretch 20" each Cat/camel 6x Quadruped UE/LE 8x TRX DL Squat 10x Low Row 20# 2x15 Corner Pec Stretch 3x20"  4 strips Kinesiotape o l-spine (1 each along B paraspinals @ 30-50%, 2 horiz @ 75% at upper and lower L paraspinals)                      PT Education - 05/03/15 1408    Education provided Yes   Education Details HEP, kinesiology taping   Person(s) Educated Patient   Methods Explanation;Demonstration;Handout   Comprehension Verbalized understanding;Returned demonstration              PT Long Term Goals - 05/03/15 1410    PT LONG TERM GOAL #1   Title pt independent with HEP as necessary by 06/01/15   Status On-going   PT LONG TERM GOAL #2   Title pt able to stand as needed to cook etc without limitation by LBP by 06/01/15   Status On-going   PT LONG TERM GOAL #3   Title pt able to perform chores, ADLs, recreational activities without LBP greater than 2/10 by 06/01/15   Status On-going   PT LONG TERM GOAL #4   Title pt reports LBP frequency decreases from constant to less than 50% of time by 06/01/15   Status On-going               Plan - 05/03/15 1409    Clinical Impression Statement pt with increased pain again following last treatment.  Pt denies noting pain while here but after leaving and for next 3 days she c/o pain up to 5/10.  Today no manual and avoided all rotation.  Also trial of kinesiology taping today.   PT Next Visit Plan lumbopelvic stablity progressions; manual / modalities PRN; taping PRN   Consulted and Agree with Plan of  Care Patient        Problem List There are no active problems to display for this patient.   Regla Fitzgibbon PT, OCS 05/03/2015, 2:11 PM  [facility] [address]  [place], [place], [address] Phone: [phone]   Fax:  [phone]

## 2015-05-06 ENCOUNTER — Ambulatory Visit: Payer: Medicare Other | Admitting: Physical Therapy

## 2015-05-10 ENCOUNTER — Ambulatory Visit: Payer: Medicare Other | Admitting: Physical Therapy

## 2015-05-10 DIAGNOSIS — M545 Low back pain, unspecified: Secondary | ICD-10-CM

## 2015-05-10 NOTE — Therapy (Signed)
[facility] [address]  [place], [place], [address] Phone: [phone]   Fax:  [phone]  Physical Therapy Treatment  Patient Details  Name: Sharon Newton MRN: [id number] Date of Birth: [birth year] Referring Provider:  Leeroy Cha, MD  Encounter Date: 05/10/2015      PT End of Session - 05/10/15 1322    Visit Number 6   Number of Visits 12   Date for PT Re-Evaluation 06/01/15   PT Start Time 1316   PT Stop Time 1357   PT Time Calculation (min) 41 min      Past Medical History  Diagnosis Date  . GERD (gastroesophageal reflux disease)   . Personal history of CLL (chronic lymphocytic leukemia) 2010    symptom free since chemo 4 years ago    Past Surgical History  Procedure Laterality Date  . Foot surgery    . Hand surgery      There were no vitals filed for this visit.  Visit Diagnosis:  Bilateral low back pain without sciatica      Subjective Assessment - 05/10/15 1318    Subjective pt states felt very good following last treatment until she performed HEP over the weekend.  She states that she thinks pain was due to cat/camel but unsure as there was no pain during HEP but began approx 3 hours later.  Pain up to 5-6/10.  States she felt very good Thurs, Friday, and Saturday AM then performed cat/camel and pain increased Saturday PM.  Decreased some since then.    Currently in Pain? Yes   Pain Score 3    Pain Location Back   Pain Orientation Left         TODAY'S TREATMENT TherEx -  Bridge 10x Partial Curl-up 15x Reverse Curl-up 15x Hooklying Hip ABD Black TB 15x Seated Trunk flexion stretch on PBall 8x3" then same with diagonal stretches 8x3" TRX DL Squat 15x TRX Low Row12x Corner Pec Stretch 2x20" Back to Wall with 1/2 Foam Roll B Horiz ABD Green TB 12x; B ER Green TB 12x              PT Long Term Goals - 05/03/15 1410    PT LONG TERM GOAL #1   Title pt independent with  HEP as necessary by 06/01/15   Status On-going   PT LONG TERM GOAL #2   Title pt able to stand as needed to cook etc without limitation by LBP by 06/01/15   Status On-going   PT LONG TERM GOAL #3   Title pt able to perform chores, ADLs, recreational activities without LBP greater than 2/10 by 06/01/15   Status On-going   PT LONG TERM GOAL #4   Title pt reports LBP frequency decreases from constant to less than 50% of time by 06/01/15   Status On-going               Plan - 05/10/15 1401    Clinical Impression Statement pt minimal pain for several days following last treatment but then performed HEP and noted increased pain a few hours later this past weekend.  She believes she may have pushed too far/hard with cat/camel.  Today she again performed very well with no pain during treatment but this is often the case then notes pain later.  Tape seems to help great deal per pt report.   PT Next Visit Plan lumbopelvic stablity gradual progressions; manual / modalities PRN; taping PRN  Consulted and Agree with Plan of Care Patient        Problem List There are no active problems to display for this patient.   [place] PT, OCS 05/10/2015, 2:03 PM  [facility] [address]  [place], [place], [address] Phone: [phone]   Fax:  [phone]

## 2015-05-13 ENCOUNTER — Ambulatory Visit: Payer: Medicare Other | Admitting: Rehabilitation

## 2015-05-13 DIAGNOSIS — M545 Low back pain, unspecified: Secondary | ICD-10-CM

## 2015-05-13 NOTE — Therapy (Addendum)
[facility] [address]  [place], [place], [address] Phone: [phone]   Fax:  [phone]  Physical Therapy Treatment  Patient Details  Name: Sharon Newton MRN: [id number] Date of Birth: 05-16-1946 Referring Provider:  Leeroy Cha, MD  Encounter Date: 05/13/2015      PT End of Session - 05/13/15 1357    Visit Number 7   Number of Visits 12   Date for PT Re-Evaluation 06/01/15   PT Start Time 4193   PT Stop Time 1435   PT Time Calculation (min) 40 min      Past Medical History  Diagnosis Date  . GERD (gastroesophageal reflux disease)   . Personal history of CLL (chronic lymphocytic leukemia) 2010    symptom free since chemo 4 years ago    Past Surgical History  Procedure Laterality Date  . Foot surgery    . Hand surgery      There were no vitals filed for this visit.  Visit Diagnosis:  Bilateral low back pain without sciatica      Subjective Assessment - 05/13/15 1357    Subjective Reports doing ok today but yesterday was bad. Felt fine after last time.    Currently in Pain? Yes   Pain Score 3    Pain Location Back   Pain Orientation Left   Pain Descriptors / Indicators Aching;Sharp      TODAY'S TREATMENT TherEx -  Bridge 10x Reverse Curl-up 15x Hooklying Hip ABD Black TB 20x Partial Curl-up 15x Seated Trunk flexion stretch on PBall 10x3" then same with diagonal stretches 10x3" TRX DL Squat 15x Back to Wall with 1/2 Foam Roll B Horiz ABD Green TB 12x; B ER Green TB 12x Low Row 20# 15x, Single Low Row 10# 10x each side Corner Pec Stretch 2x20" TRX Low Row12x Supine TrA with Alt SLR 12x       PT Long Term Goals - 05/03/15 1410    PT LONG TERM GOAL #1   Title pt independent with HEP as necessary by 06/01/15   Status On-going   PT LONG TERM GOAL #2   Title pt able to stand as needed to cook etc without limitation by LBP by 06/01/15   Status On-going   PT LONG TERM GOAL #3   Title pt able to perform chores, ADLs, recreational activities without LBP greater than 2/10 by 06/01/15   Status On-going   PT LONG TERM GOAL #4   Title pt reports LBP frequency decreases from constant to less than 50% of time by 06/01/15   Status On-going       G-Code: mobility, walking around: current and discharge status CJ (20-40% limitation), goal was CI (1-20% limitation)        Plan - 05/13/15 1433    Clinical Impression Statement Pt reported no complaint of pain with exercises and attempted a rotational stabilizations (single low row) with good report. Pt requested to not perform twisting exercises though stating they typically cause pain. Pt has been having her husband tape her at home and reports good relief with this.    PT Next Visit Plan lumbopelvic stablity gradual progressions; manual / modalities PRN; taping PRN   Consulted and Agree with Plan of Care Patient        Problem List There are no active problems to display for this patient.   Barbette Hair, PTA 05/13/2015, 2:35 PM  [facility] [address]  [place], [place], [address] Phone: [phone]   Fax:  [phone]     PHYSICAL THERAPY DISCHARGE SUMMARY  Visits from Start of Care: 7  Current functional level related to goals / functional outcomes: Some progress with PT but recently diagnosed with CA which may be contributing to back pain.   Remaining deficits: Pain    Plan: Patient agrees to discharge.  Patient goals were not met. Patient is being discharged due to a change in medical status.  ?????       Sharon Newton was seen for 7 PT treatments due to LBP.  Her last treatment was on 05/13/15.  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  Due to this, she is no longer participating in PT while she focuses on CA treatments.  We are therefore discharging Sharon Newton from our care  for LBP.  We wish her all the best and our thoughts remain with her.  If there is anything we can offer in her rehab we remain available.  Leonette Most PT, OCS 06/29/2015 8:39 AM

## 2015-05-15 ENCOUNTER — Emergency Department (HOSPITAL_COMMUNITY): Payer: Medicare Other

## 2015-05-15 ENCOUNTER — Encounter (HOSPITAL_COMMUNITY): Payer: Self-pay | Admitting: Emergency Medicine

## 2015-05-15 ENCOUNTER — Emergency Department (HOSPITAL_COMMUNITY)
Admission: EM | Admit: 2015-05-15 | Discharge: 2015-05-16 | Disposition: A | Discharge status: Home / Self Care | Payer: Medicare Other | Attending: Emergency Medicine | Admitting: Emergency Medicine

## 2015-05-15 DIAGNOSIS — M5440 Lumbago with sciatica, unspecified side: Secondary | ICD-10-CM | POA: Insufficient documentation

## 2015-05-15 DIAGNOSIS — K219 Gastro-esophageal reflux disease without esophagitis: Secondary | ICD-10-CM | POA: Diagnosis not present

## 2015-05-15 DIAGNOSIS — R109 Unspecified abdominal pain: Secondary | ICD-10-CM | POA: Diagnosis present

## 2015-05-15 DIAGNOSIS — F1310 Sedative, hypnotic or anxiolytic abuse, uncomplicated: Secondary | ICD-10-CM | POA: Insufficient documentation

## 2015-05-15 DIAGNOSIS — F1210 Cannabis abuse, uncomplicated: Secondary | ICD-10-CM | POA: Diagnosis not present

## 2015-05-15 DIAGNOSIS — Z87891 Personal history of nicotine dependence: Secondary | ICD-10-CM | POA: Diagnosis not present

## 2015-05-15 DIAGNOSIS — F419 Anxiety disorder, unspecified: Secondary | ICD-10-CM | POA: Insufficient documentation

## 2015-05-15 DIAGNOSIS — Z79899 Other long term (current) drug therapy: Secondary | ICD-10-CM | POA: Diagnosis not present

## 2015-05-15 DIAGNOSIS — K869 Disease of pancreas, unspecified: Secondary | ICD-10-CM

## 2015-05-15 DIAGNOSIS — J45909 Unspecified asthma, uncomplicated: Secondary | ICD-10-CM | POA: Insufficient documentation

## 2015-05-15 DIAGNOSIS — M545 Low back pain: Secondary | ICD-10-CM

## 2015-05-15 LAB — CBC WITH DIFFERENTIAL/PLATELET
BASOS PCT: 1 % (ref 0–1)
Basophils Absolute: 0 10*3/uL (ref 0.0–0.1)
EOS ABS: 0.1 10*3/uL (ref 0.0–0.7)
Eosinophils Relative: 1 % (ref 0–5)
HCT: 34.5 % — ABNORMAL LOW (ref 36.0–46.0)
HEMOGLOBIN: 12 g/dL (ref 12.0–15.0)
Lymphocytes Relative: 31 % (ref 12–46)
Lymphs Abs: 2 10*3/uL (ref 0.7–4.0)
MCH: 31.5 pg (ref 26.0–34.0)
MCHC: 34.8 g/dL (ref 30.0–36.0)
MCV: 90.6 fL (ref 78.0–100.0)
MONO ABS: 0.7 10*3/uL (ref 0.1–1.0)
MONOS PCT: 10 % (ref 3–12)
NEUTROS PCT: 57 % (ref 43–77)
Neutro Abs: 3.8 10*3/uL (ref 1.7–7.7)
Platelets: 171 10*3/uL (ref 150–400)
RBC: 3.81 MIL/uL — ABNORMAL LOW (ref 3.87–5.11)
RDW: 14.2 % (ref 11.5–15.5)
WBC: 6.5 10*3/uL (ref 4.0–10.5)

## 2015-05-15 LAB — COMPREHENSIVE METABOLIC PANEL
ALK PHOS: 71 U/L (ref 38–126)
ALT: 13 U/L — AB (ref 14–54)
AST: 39 U/L (ref 15–41)
Albumin: 3.7 g/dL (ref 3.5–5.0)
Anion gap: 9 (ref 5–15)
BILIRUBIN TOTAL: 0.9 mg/dL (ref 0.3–1.2)
BUN: 13 mg/dL (ref 6–20)
CALCIUM: 9.3 mg/dL (ref 8.9–10.3)
CO2: 25 mmol/L (ref 22–32)
CREATININE: 1.03 mg/dL — AB (ref 0.44–1.00)
Chloride: 107 mmol/L (ref 101–111)
GFR calc non Af Amer: 55 mL/min — ABNORMAL LOW (ref >60)
Glucose, Bld: 117 mg/dL — ABNORMAL HIGH (ref 65–99)
Potassium: 4.6 mmol/L (ref 3.5–5.1)
SODIUM: 141 mmol/L (ref 135–145)
Total Protein: 5.6 g/dL — ABNORMAL LOW (ref 6.5–8.1)

## 2015-05-15 LAB — I-STAT CHEM 8, ED
BUN: 19 mg/dL (ref 6–20)
CALCIUM ION: 1.12 mmol/L — AB (ref 1.13–1.30)
Chloride: 105 mmol/L (ref 101–111)
Creatinine, Ser: 0.9 mg/dL (ref 0.44–1.00)
GLUCOSE: 111 mg/dL — AB (ref 65–99)
HCT: 35 % — ABNORMAL LOW (ref 36.0–46.0)
HEMOGLOBIN: 11.9 g/dL — AB (ref 12.0–15.0)
Potassium: 4.5 mmol/L (ref 3.5–5.1)
Sodium: 140 mmol/L (ref 135–145)
TCO2: 24 mmol/L (ref 0–100)

## 2015-05-15 LAB — I-STAT TROPONIN, ED: TROPONIN I, POC: 0 ng/mL (ref 0.00–0.08)

## 2015-05-15 LAB — LIPASE, BLOOD: LIPASE: 19 U/L — AB (ref 22–51)

## 2015-05-15 LAB — I-STAT CG4 LACTIC ACID, ED: LACTIC ACID, VENOUS: 1.49 mmol/L (ref 0.5–2.0)

## 2015-05-15 MED ORDER — ONDANSETRON HCL 4 MG/2ML IJ SOLN
4.0000 mg | Freq: Once | INTRAMUSCULAR | Status: AC
  Administered 2015-05-15: 4 mg via INTRAVENOUS
  Filled 2015-05-15: qty 2

## 2015-05-15 MED ORDER — SODIUM CHLORIDE 0.9 % IV BOLUS (SEPSIS)
500.0000 mL | Freq: Once | INTRAVENOUS | Status: AC
  Administered 2015-05-15: 500 mL via INTRAVENOUS

## 2015-05-15 MED ORDER — LORAZEPAM 2 MG/ML IJ SOLN
1.0000 mg | Freq: Once | INTRAMUSCULAR | Status: AC
  Administered 2015-05-15: 1 mg via INTRAVENOUS

## 2015-05-15 MED ORDER — LORAZEPAM 2 MG/ML IJ SOLN
INTRAMUSCULAR | Status: AC
  Filled 2015-05-15: qty 1

## 2015-05-15 NOTE — ED Provider Notes (Signed)
CSN: [id number]     Arrival date & time 05/15/15  2228 History   First MD Initiated Contact with Patient 05/15/15 2230     Chief Complaint  Patient presents with  . Abdominal Pain     Patient is a 69 y.o. female presenting with abdominal pain. The history is provided by the patient, the spouse and the EMS personnel. No language interpreter was used.  Abdominal Pain  Sharon Newton presents for evaluation of abdominal pain. About 2 hours prior to ED arrival she developed abdominal pain. This described as a spasm across her entire abdomen and radiates to the left side. Pain waxes and wanes and is present for a few minutes at a time. Between pain episodes she is completely pain-free. She did smoke a small amount of marijuana and drink a small amount of wine tonight. She has no history of trauma and no similar previous symptoms. She denies any fevers, chest pain, shortness of breath, vomiting, dysuria, diarrhea, constipation.  Past Medical History  Diagnosis Date  . GERD (gastroesophageal reflux disease)   . Personal history of CLL (chronic lymphocytic leukemia) 2010    symptom free since chemo 4 years ago  . Asthma    Past Surgical History  Procedure Laterality Date  . Foot surgery    . Hand surgery     No family history on file. Social History  Substance Use Topics  . Smoking status: Former Research scientist (life sciences)  . Smokeless tobacco: Never Used  . Alcohol Use: None   OB History    No data available     Review of Systems  Gastrointestinal: Positive for abdominal pain.  All other systems reviewed and are negative.     Allergies  Oxycodone and Bactrim  Home Medications   Prior to Admission medications   Medication Sig Start Date End Date Taking? Authorizing Provider  PARoxetine (PAXIL) 10 MG tablet  02/02/14  Yes Historical Provider, MD  fluticasone (FLONASE) 50 MCG/ACT nasal spray Place into both nostrils daily.    Historical Provider, MD  omeprazole (PRILOSEC) 40 MG capsule  01/28/14    Historical Provider, MD  PREMPRO 0.3-1.5 MG per tablet  01/20/14   Historical Provider, MD  promethazine (PHENERGAN) 12.5 MG tablet Take 2 tablets (25 mg total) by mouth every 6 (six) hours as needed for nausea or vomiting. Patient not taking: Reported on 04/20/2015 03/05/14   Max T Hyatt, DPM  temazepam (RESTORIL) 30 MG capsule  01/09/14   Historical Provider, MD  ZETIA 10 MG tablet  02/02/14   Historical Provider, MD   BP 105/52 mmHg  Pulse 81  Temp(Src) 97.5 F (36.4 C) (Oral)  Resp 22  SpO2 91% Physical Exam  Constitutional: She is oriented to person, place, and time. She appears well-developed and well-nourished. She appears distressed.  HENT:  Head: Normocephalic and atraumatic.  Cardiovascular: Normal rate and regular rhythm.   No murmur heard. Pulmonary/Chest: Effort normal and breath sounds normal. No respiratory distress.  Abdominal:  Abdomen is firm and diffusely tender during pain episodes, soft and nontender in between pain episodes  Musculoskeletal: She exhibits no edema or tenderness.  Neurological: She is alert and oriented to person, place, and time.  Skin: Skin is warm and dry.  Psychiatric:  Anxious  Nursing note and vitals reviewed.   ED Course  Procedures (including critical care time) Labs Review Labs Reviewed  COMPREHENSIVE METABOLIC PANEL - Abnormal; Notable for the following:    Glucose, Bld 117 (*)    Creatinine, Ser  1.03 (*)    Total Protein 5.6 (*)    ALT 13 (*)    GFR calc non Af Amer 55 (*)    All other components within normal limits  CBC WITH DIFFERENTIAL/PLATELET - Abnormal; Notable for the following:    RBC 3.81 (*)    HCT 34.5 (*)    All other components within normal limits  LIPASE, BLOOD - Abnormal; Notable for the following:    Lipase 19 (*)    All other components within normal limits  I-STAT CHEM 8, ED - Abnormal; Notable for the following:    Glucose, Bld 111 (*)    Calcium, Ion 1.12 (*)    Hemoglobin 11.9 (*)    HCT 35.0 (*)     All other components within normal limits  URINALYSIS, ROUTINE W REFLEX MICROSCOPIC (NOT AT [facility])  URINE RAPID DRUG SCREEN, HOSP PERFORMED  I-STAT CG4 LACTIC ACID, ED  I-STAT TROPOININ, ED    Imaging Review No results found. I have personally reviewed and evaluated these images and lab results as part of my medical decision-making.   EKG Interpretation None      MDM   Final diagnoses:  None    Patient here for evaluation of intermittent abdominal cramping and spasms, completely resolved in between episodes. Patient had partial improvement with Ativan. UA pending, CT scan pending. Patient care transferred pending CT and urinalysis.    Sharon Reichert, MD 05/16/15 [phone]

## 2015-05-15 NOTE — ED Notes (Signed)
Ptarrives via EMS with c/o generalized abd pain, guarding, very sensitive in flank pain. Intermittent. Pt writhing around in bed in pain.

## 2015-05-15 NOTE — ED Notes (Signed)
Pt reports marijuana smoking and wine tonight. MD and husband at bedside.

## 2015-05-15 NOTE — ED Notes (Signed)
MD at bedside. 

## 2015-05-16 ENCOUNTER — Emergency Department (HOSPITAL_COMMUNITY): Payer: Medicare Other

## 2015-05-16 ENCOUNTER — Encounter (HOSPITAL_COMMUNITY): Payer: Self-pay | Admitting: Radiology

## 2015-05-16 LAB — URINALYSIS, ROUTINE W REFLEX MICROSCOPIC
Bilirubin Urine: NEGATIVE
GLUCOSE, UA: NEGATIVE mg/dL
Hgb urine dipstick: NEGATIVE
KETONES UR: 15 mg/dL — AB
LEUKOCYTES UA: NEGATIVE
NITRITE: NEGATIVE
PH: 6.5 (ref 5.0–8.0)
Protein, ur: NEGATIVE mg/dL
Specific Gravity, Urine: >1.046 — ABNORMAL HIGH (ref 1.005–1.030)
Urobilinogen, UA: 0.2 mg/dL (ref 0.0–1.0)

## 2015-05-16 LAB — RAPID URINE DRUG SCREEN, HOSP PERFORMED
AMPHETAMINES: NOT DETECTED
BARBITURATES: NOT DETECTED
BENZODIAZEPINES: POSITIVE — AB
COCAINE: NOT DETECTED
Opiates: NOT DETECTED
Tetrahydrocannabinol: POSITIVE — AB

## 2015-05-16 MED ORDER — MORPHINE SULFATE (PF) 4 MG/ML IV SOLN
4.0000 mg | Freq: Once | INTRAVENOUS | Status: AC
  Administered 2015-05-16: 4 mg via INTRAVENOUS
  Filled 2015-05-16: qty 1

## 2015-05-16 MED ORDER — SODIUM CHLORIDE 0.9 % IV BOLUS (SEPSIS)
1000.0000 mL | Freq: Once | INTRAVENOUS | Status: AC
  Administered 2015-05-16: 1000 mL via INTRAVENOUS

## 2015-05-16 MED ORDER — DIAZEPAM 2 MG PO TABS: 2.0000 mg | ORAL_TABLET | Freq: Four times a day (QID) | ORAL | Status: AC | PRN

## 2015-05-16 MED ORDER — IOHEXOL 300 MG/ML  SOLN
100.0000 mL | Freq: Once | INTRAMUSCULAR | Status: AC | PRN
  Administered 2015-05-16: 100 mL via INTRAVENOUS

## 2015-05-16 MED ORDER — HYDROCODONE-ACETAMINOPHEN 5-325 MG PO TABS: 1.0000 | ORAL_TABLET | Freq: Four times a day (QID) | ORAL | Status: AC | PRN

## 2015-05-16 MED ORDER — HYDROCODONE-ACETAMINOPHEN 5-325 MG PO TABS
1.0000 | ORAL_TABLET | Freq: Once | ORAL | Status: AC
  Administered 2015-05-16: 1 via ORAL
  Filled 2015-05-16: qty 1

## 2015-05-16 MED ORDER — DIAZEPAM 2 MG PO TABS
2.0000 mg | ORAL_TABLET | Freq: Once | ORAL | Status: AC
  Administered 2015-05-16: 2 mg via ORAL
  Filled 2015-05-16: qty 1

## 2015-05-16 MED ORDER — GADOBENATE DIMEGLUMINE 529 MG/ML IV SOLN
15.0000 mL | Freq: Once | INTRAVENOUS | Status: AC | PRN
  Administered 2015-05-16: 13 mL via INTRAVENOUS

## 2015-05-16 NOTE — ED Notes (Signed)
Pt at MRI. Has been reminded numerous times by nursing to get urine specimen.
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
## 2015-05-16 NOTE — ED Notes (Signed)
Husband at nurses station stating pt has been in "agonizing pain" for 8 hours. MD has been at beside multiple times as well as RN checking on pain status. Pt has not presented as she did initially since ativan administration.

## 2015-05-16 NOTE — ED Notes (Signed)
Pt has been in and out of sleep for the last several hours. Takes multiple attempts to awaken.

## 2015-05-16 NOTE — ED Notes (Addendum)
Patient describes a burning pain in her back.

## 2015-05-17 ENCOUNTER — Ambulatory Visit: Payer: Medicare Other | Admitting: Physical Therapy

## 2015-05-20 ENCOUNTER — Ambulatory Visit: Payer: Medicare Other | Admitting: Physical Therapy

## 2015-05-25 ENCOUNTER — Ambulatory Visit: Payer: Medicare Other | Admitting: Physical Therapy

## 2015-10-31 ENCOUNTER — Emergency Department (HOSPITAL_COMMUNITY)
Admission: EM | Admit: 2015-10-31 | Discharge: 2015-10-31 | Disposition: A | Discharge status: Home / Self Care | Payer: Medicare Other | Attending: Emergency Medicine | Admitting: Emergency Medicine

## 2015-10-31 ENCOUNTER — Encounter (HOSPITAL_COMMUNITY): Payer: Self-pay

## 2015-10-31 ENCOUNTER — Emergency Department (HOSPITAL_COMMUNITY): Payer: Medicare Other

## 2015-10-31 DIAGNOSIS — K219 Gastro-esophageal reflux disease without esophagitis: Secondary | ICD-10-CM | POA: Insufficient documentation

## 2015-10-31 DIAGNOSIS — K2970 Gastritis, unspecified, without bleeding: Secondary | ICD-10-CM | POA: Insufficient documentation

## 2015-10-31 DIAGNOSIS — Z91040 Latex allergy status: Secondary | ICD-10-CM | POA: Insufficient documentation

## 2015-10-31 DIAGNOSIS — R109 Unspecified abdominal pain: Secondary | ICD-10-CM

## 2015-10-31 DIAGNOSIS — C259 Malignant neoplasm of pancreas, unspecified: Secondary | ICD-10-CM | POA: Diagnosis not present

## 2015-10-31 DIAGNOSIS — Z79899 Other long term (current) drug therapy: Secondary | ICD-10-CM | POA: Diagnosis not present

## 2015-10-31 DIAGNOSIS — Z856 Personal history of leukemia: Secondary | ICD-10-CM | POA: Diagnosis not present

## 2015-10-31 DIAGNOSIS — Z9049 Acquired absence of other specified parts of digestive tract: Secondary | ICD-10-CM | POA: Diagnosis not present

## 2015-10-31 DIAGNOSIS — Z87891 Personal history of nicotine dependence: Secondary | ICD-10-CM | POA: Diagnosis not present

## 2015-10-31 DIAGNOSIS — Z87440 Personal history of urinary (tract) infections: Secondary | ICD-10-CM | POA: Diagnosis not present

## 2015-10-31 HISTORY — DX: Leukemia, unspecified not having achieved remission: C95.90

## 2015-10-31 HISTORY — DX: Malignant neoplasm of pancreas, unspecified: C25.9

## 2015-10-31 HISTORY — DX: Unspecified appendicitis: K37

## 2015-10-31 HISTORY — DX: Urinary tract infection, site not specified: N39.0

## 2015-10-31 LAB — COMPREHENSIVE METABOLIC PANEL
ALT: 20 U/L (ref 14–54)
AST: 31 U/L (ref 15–41)
Albumin: 4.7 g/dL (ref 3.5–5.0)
Alkaline Phosphatase: 85 U/L (ref 38–126)
Anion gap: 11 (ref 5–15)
BUN: 10 mg/dL (ref 6–20)
CO2: 29 mmol/L (ref 22–32)
Calcium: 10 mg/dL (ref 8.9–10.3)
Chloride: 99 mmol/L — ABNORMAL LOW (ref 101–111)
Creatinine, Ser: 0.66 mg/dL (ref 0.44–1.00)
GFR calc Af Amer: >60 mL/min (ref >60)
GFR calc non Af Amer: >60 mL/min (ref >60)
Glucose, Bld: 131 mg/dL — ABNORMAL HIGH (ref 65–99)
Potassium: 4 mmol/L (ref 3.5–5.1)
Sodium: 139 mmol/L (ref 135–145)
Total Bilirubin: 0.7 mg/dL (ref 0.3–1.2)
Total Protein: 6.7 g/dL (ref 6.5–8.1)

## 2015-10-31 LAB — URINALYSIS, ROUTINE W REFLEX MICROSCOPIC
BILIRUBIN URINE: NEGATIVE
GLUCOSE, UA: NEGATIVE mg/dL
HGB URINE DIPSTICK: NEGATIVE
KETONES UR: NEGATIVE mg/dL
Nitrite: NEGATIVE
PH: 7.5 (ref 5.0–8.0)
PROTEIN: NEGATIVE mg/dL
Specific Gravity, Urine: 1.021 (ref 1.005–1.030)

## 2015-10-31 LAB — URINE MICROSCOPIC-ADD ON: RBC / HPF: NONE SEEN RBC/hpf (ref 0–5)

## 2015-10-31 LAB — CBC
HCT: 34.7 % — ABNORMAL LOW (ref 36.0–46.0)
Hemoglobin: 11.9 g/dL — ABNORMAL LOW (ref 12.0–15.0)
MCH: 33.1 pg (ref 26.0–34.0)
MCHC: 34.3 g/dL (ref 30.0–36.0)
MCV: 96.7 fL (ref 78.0–100.0)
Platelets: 99 10*3/uL — ABNORMAL LOW (ref 150–400)
RBC: 3.59 MIL/uL — ABNORMAL LOW (ref 3.87–5.11)
RDW: 14.6 % (ref 11.5–15.5)
WBC: 4 10*3/uL (ref 4.0–10.5)

## 2015-10-31 LAB — LIPASE, BLOOD: LIPASE: 11 U/L (ref 11–51)

## 2015-10-31 MED ORDER — IOHEXOL 300 MG/ML  SOLN
100.0000 mL | Freq: Once | INTRAMUSCULAR | Status: AC | PRN
  Administered 2015-10-31: 100 mL via INTRAVENOUS

## 2015-10-31 MED ORDER — FENTANYL CITRATE (PF) 100 MCG/2ML IJ SOLN
100.0000 ug | Freq: Once | INTRAMUSCULAR | Status: AC
Start: 2015-10-31 — End: 2015-10-31
  Administered 2015-10-31: 100 ug via INTRAVENOUS
  Filled 2015-10-31: qty 2

## 2015-10-31 MED ORDER — FAMOTIDINE 20 MG PO TABS
20.0000 mg | ORAL_TABLET | Freq: Two times a day (BID) | ORAL | Status: AC
Start: 2015-10-31 — End: ?

## 2015-10-31 MED ORDER — HYDROMORPHONE HCL 1 MG/ML IJ SOLN
1.0000 mg | Freq: Once | INTRAMUSCULAR | Status: AC
Start: 2015-10-31 — End: 2015-10-31
  Administered 2015-10-31: 1 mg via INTRAVENOUS
  Filled 2015-10-31: qty 1

## 2015-10-31 MED ORDER — FAMOTIDINE IN NACL 20-0.9 MG/50ML-% IV SOLN
20.0000 mg | Freq: Once | INTRAVENOUS | Status: AC
  Administered 2015-10-31: 20 mg via INTRAVENOUS
  Filled 2015-10-31: qty 50

## 2015-10-31 MED ORDER — SODIUM CHLORIDE 0.9 % IV BOLUS (SEPSIS)
1000.0000 mL | Freq: Once | INTRAVENOUS | Status: AC
  Administered 2015-10-31: 1000 mL via INTRAVENOUS

## 2015-10-31 MED ORDER — FENTANYL CITRATE (PF) 100 MCG/2ML IJ SOLN
50.0000 ug | Freq: Once | INTRAMUSCULAR | Status: AC
  Administered 2015-10-31: 50 ug via INTRAVENOUS
  Filled 2015-10-31: qty 2

## 2015-10-31 MED ORDER — ONDANSETRON HCL 4 MG/2ML IJ SOLN
4.0000 mg | Freq: Once | INTRAMUSCULAR | Status: AC | PRN
  Administered 2015-10-31: 4 mg via INTRAVENOUS
  Filled 2015-10-31: qty 2

## 2015-10-31 MED ORDER — PANTOPRAZOLE SODIUM 40 MG IV SOLR
40.0000 mg | Freq: Once | INTRAVENOUS | Status: AC
  Administered 2015-10-31: 40 mg via INTRAVENOUS
  Filled 2015-10-31: qty 40

## 2015-10-31 MED ORDER — GI COCKTAIL ~~LOC~~
30.0000 mL | Freq: Once | ORAL | Status: AC
  Administered 2015-10-31: 30 mL via ORAL
  Filled 2015-10-31: qty 30

## 2015-10-31 MED ORDER — MORPHINE SULFATE (PF) 4 MG/ML IV SOLN
8.0000 mg | Freq: Once | INTRAVENOUS | Status: AC
  Administered 2015-10-31: 8 mg via INTRAVENOUS
  Filled 2015-10-31: qty 2

## 2015-10-31 MED ORDER — SUCRALFATE 1 G PO TABS
1.0000 g | ORAL_TABLET | Freq: Three times a day (TID) | ORAL | Status: AC
Start: 2015-10-31 — End: ?

## 2015-10-31 MED ORDER — IOHEXOL 300 MG/ML  SOLN
50.0000 mL | Freq: Once | INTRAMUSCULAR | Status: AC | PRN
  Administered 2015-10-31: 50 mL via ORAL

## 2015-10-31 NOTE — Discharge Instructions (Signed)
Continue your pain meds.  Add pepcid twice daily and carafate with meals.   See your GI doctor at [facility] tomorrow for endoscopy.   Return to ER if you have worse abdominal pain, fever, vomiting.

## 2015-10-31 NOTE — ED Provider Notes (Signed)
CSN: [id number]     Arrival date & time 10/31/15  [id number] History   First MD Initiated Contact with Patient 10/31/15 [phone]     Chief Complaint  Patient presents with  . Flank Pain    Right Side  . Nausea  . Emesis     (Consider location/radiation/quality/duration/timing/severity/associated sxs/prior Treatment) The history is provided by the patient.  Sharon Newton is a 70 y.o. female hx of GERD, pancreatic cancer on oral chemo and on radiation (last one 2 days ago), here with severe flank pain, abdominal pain. She states that she has right-sided flank and abdominal pain for the last 2 days. She has been taking her morphine that does not help her. She feels nauseated and vomited once but had no fevers. Denies any urinary symptoms and denies any blood in her urine. She has been followed up with oncology at [facility].      Past Medical History  Diagnosis Date  . GERD (gastroesophageal reflux disease)   . Personal history of CLL (chronic lymphocytic leukemia) 2010    symptom free since chemo 4 years ago  . Pancreatic cancer ([place])   . Leukemia ([place])   . Appendicitis   . UTI (urinary tract infection)    Past Surgical History  Procedure Laterality Date  . Foot surgery    . Hand surgery    . Appendectomy    . Knee surgery     History reviewed. No pertinent family history. Social History  Substance Use Topics  . Smoking status: Former Research scientist (life sciences)  . Smokeless tobacco: Never Used  . Alcohol Use: None   OB History    No data available     Review of Systems  Gastrointestinal: Positive for vomiting and abdominal pain.  Genitourinary: Positive for flank pain.  All other systems reviewed and are negative.     Allergies  Adhesive; Demerol; Oxycodone; Statins; Bactrim; and Latex  Home Medications   Prior to Admission medications   Medication Sig Start Date End Date Taking? Authorizing Provider  Ascorbic Acid (VITAMIN C) 1000 MG tablet Take 1,000 mg by mouth every morning.   Yes  Historical Provider, MD  Biotin 10 MG CAPS Take 20 mcg by mouth every evening.   Yes Historical Provider, MD  capecitabine (XELODA) 500 MG tablet Take 1,000-1,500 mg by mouth every 12 (twelve) hours. Take 3 tablets in the morning and 2 tablets in the evening. Take 12 hours apart. Take on days you are receiving radiation therapy   Yes Historical Provider, MD  diazepam (VALIUM) 2 MG tablet Take 1 tablet (2 mg total) by mouth every 6 (six) hours as needed (back pain). 05/16/15  Yes Merrily Pew, MD  docusate sodium (COLACE) 100 MG capsule Take 200 mg by mouth daily.    Yes Historical Provider, MD  lipase/protease/amylase (CREON) 12000 units CPEP capsule Take 24,000 Units by mouth 3 (three) times daily with meals.   Yes Historical Provider, MD  morphine (MS CONTIN) 30 MG 12 hr tablet Take 30 mg by mouth daily.  06/10/15  Yes Historical Provider, MD  morphine (MSIR) 15 MG tablet Take 15 mg by mouth every 4 (four) hours as needed for moderate pain.  10/28/15 11/11/15 Yes Historical Provider, MD  omeprazole (PRILOSEC) 40 MG capsule Take 40 mg by mouth daily.  01/28/14  Yes Historical Provider, MD  ondansetron (ZOFRAN) 8 MG tablet Take 8 mg by mouth every 8 (eight) hours as needed for nausea or vomiting.   Yes Historical Provider, MD  prochlorperazine (COMPAZINE) 10 MG tablet Take 10 mg by mouth every 6 (six) hours as needed for nausea or vomiting.   Yes Historical Provider, MD  promethazine (PHENERGAN) 12.5 MG tablet Take 2 tablets (25 mg total) by mouth every 6 (six) hours as needed for nausea or vomiting. 03/05/14  Yes Max T Hyatt, DPM  temazepam (RESTORIL) 30 MG capsule Take 30 mg by mouth at bedtime as needed for sleep.  01/09/14  Yes Historical Provider, MD  ZETIA 10 MG tablet Take 10 mg by mouth daily.  02/02/14  Yes Historical Provider, MD  HYDROcodone-acetaminophen (NORCO/VICODIN) 5-325 MG per tablet Take 1 tablet by mouth every 6 (six) hours as needed for moderate pain. Patient not taking: Reported on  10/31/2015 05/16/15   Merrily Pew, MD   BP 126/61 mmHg  Pulse 76  Temp(Src) 98.7 F (37.1 C) (Oral)  Resp 18  SpO2 96% Physical Exam  Constitutional: She is oriented to person, place, and time.  Uncomfortable   HENT:  Head: Normocephalic.  Mouth/Throat: Oropharynx is clear and moist.  Eyes: Conjunctivae are normal. Pupils are equal, round, and reactive to light.  Neck: Normal range of motion. Neck supple.  Cardiovascular: Normal rate, regular rhythm and normal heart sounds.   Pulmonary/Chest: Effort normal and breath sounds normal. No respiratory distress. She has no wheezes. She has no rales.  Abdominal: Soft. Bowel sounds are normal.  + mild R CVAT and RUQ tenderness. Also R iliac crest tenderness   Musculoskeletal: Normal range of motion. She exhibits no edema or tenderness.  Neurological: She is alert and oriented to person, place, and time. No cranial nerve deficit. Coordination normal.  Skin: Skin is warm and dry.  Psychiatric: She has a normal mood and affect. Her behavior is normal. Judgment and thought content normal.  Nursing note and vitals reviewed.   ED Course  Procedures (including critical care time) Labs Review Labs Reviewed  URINALYSIS, ROUTINE W REFLEX MICROSCOPIC (NOT AT [facility]) - Abnormal; Notable for the following:    Leukocytes, UA SMALL (*)    All other components within normal limits  COMPREHENSIVE METABOLIC PANEL - Abnormal; Notable for the following:    Chloride 99 (*)    Glucose, Bld 131 (*)    All other components within normal limits  CBC - Abnormal; Notable for the following:    RBC 3.59 (*)    Hemoglobin 11.9 (*)    HCT 34.7 (*)    Platelets 99 (*)    All other components within normal limits  URINE MICROSCOPIC-ADD ON - Abnormal; Notable for the following:    Squamous Epithelial / LPF 0-5 (*)    Bacteria, UA RARE (*)    All other components within normal limits  LIPASE, BLOOD    Imaging Review Ct Abdomen Pelvis W Contrast  10/31/2015   CLINICAL DATA:  Right flank pain with nausea and vomiting. History of pancreatic carcinoma EXAM: CT ABDOMEN AND PELVIS WITH CONTRAST TECHNIQUE: Multidetector CT imaging of the abdomen and pelvis was performed using the standard protocol following bolus administration of intravenous contrast. Oral contrast was also administered. CONTRAST:  107mL OMNIPAQUE IOHEXOL 300 MG/ML SOLN, 150mL OMNIPAQUE IOHEXOL 300 MG/ML SOLN COMPARISON:  May 20, 2015 FINDINGS: Lower chest: There is mild scarring in the anterior right lung base. Lung bases otherwise are clear. Hepatobiliary: There is hepatic steatosis. No focal liver lesions are apparent. Currently the gallbladder is contracted. There is no appreciable biliary duct dilatation. Pancreas: The the mass arising at the junction of  the body and tail the pancreas is again noted measuring 3.9 x 2.8 cm, smaller compared to prior study where it measured 4.9 x 3.7 cm. This apparent pancreatic neoplasm invades the surrounding soft tissues posteriorly and abuts the left adrenal and the left lateral aspect of the celiac axis. This mass largely surrounds but does not occlude the superior mesenteric artery. The mass abuts does but does not frankly invade the left adrenal. This appearance is essentially stable compared to the previous study. This mass encases and narrows the splenic artery and occludes the splenic vein in the area of the mass, also present on prior study. There is no pancreatic duct dilatation. No new pancreatic lesion is apparent. Spleen: There is a 5 mm probable splenic hemangioma along the posteromedial aspect of the spleen near the greater curvature the stomach. No other splenic lesions are identified. Adrenals/Urinary Tract: No adrenal lesions are identified. Tumor does not frankly invade the left adrenal. There is no renal mass or hydronephrosis on either side. No renal or ureteral calculus on either side. Urinary bladder is midline with wall thickness within normal  limits. Probable tiny splenic hemangioma. Oted above, the pancreatic mass abuts but does not frankly invade the left adrenal. Kidneys bilaterally show no mass or hydronephrosis on either side. There is no renal or ureteral calculus on either side. Urinary bladder is midline with wall thickness within normal limits. Stomach/Bowel: There is fold thickening in the distal gastric antrum and proximal duodenum wall regions. No well-defined ulceration or fistula seen in this area by CT. No bowel wall thickening is seen elsewhere in the abdomen or pelvis. No mesenteric thickening. No bowel obstruction. No free air or portal venous air. Vascular/Lymphatic: There is atherosclerotic calcification in the aorta and iliac arteries without aneurysm. There is moderate atherosclerotic calcification at the origin of the right renal artery. Mild calcification is noted at the origins of the celiac and superior mesenteric arteries. Please see Pancreas section for details regarding pancreatic tumor and nearby vessels. There is no demonstrable adenopathy in the abdomen or pelvis. Reproductive: There is no pelvic mass or pelvic fluid collection. The uterus is mildly retroverted. Other: Appendix is absent. No peritoneal or retroperitoneal omental metastases are appreciable. No abscess or ascites in the abdomen or pelvis. Musculoskeletal: There is degenerative change in the lumbar spine. There are no blastic or lytic bone lesions. There is lumbar scoliosis. IMPRESSION: Pancreatic mass arising from the body of the pancreas involving a portion of the tail the pancreas is slightly smaller compared to previous study. This mass causes narrowing and irregularity along the wall of a portion of the splenic artery and hips crux the splenic vein in the area of the mass. This mass surrounds much of the superior mesenteric artery and abuts the leftward aspect of the celiac artery without obstructing these vessels. This mass extends to abut the adrenal  on the left but does not frankly invade the left adrenal. There is no appreciable pancreatic duct dilatation. No new pancreatic lesion is identified. No adenopathy. Thickening in the distal gastric antrum and proximal duodenum. Suspect distal gastritis and proximal duodenitis. No well-defined ulceration in these areas. No bowel obstruction.  No abscess. Hepatic steatosis. No liver mass identified. Probable tiny splenic hemangioma. Conceivably, a small splenic metastasis could present in this manner. Appendix absent.  No renal or ureteral calculus.  No hydronephrosis. Electronically Signed   By: Lowella Grip III M.D.   On: 10/31/2015 09:22   I have personally reviewed and evaluated  these images and lab results as part of my medical decision-making.   EKG Interpretation None      MDM   Final diagnoses:  None   Sharon Newton is a 70 y.o. female here with R flank pain, R sided ab pain. Concerned for possible complications from radiation vs spread of cancer. Will get labs, repeat CT ab/pel, UA.   10:09 AM Labs at baseline. CT ab/pel showed pancreatic mass smaller compared to previous. Also has mild gastritis. Given pain meds and protonix and pepcid and GI cocktail and pain controlled. She sees GI at [facility] and has pain meds at home. She is on prilosec, will add pepcid, carafate.    Wandra Arthurs, MD 10/31/15 [id number]

## 2015-10-31 NOTE — ED Notes (Addendum)
Pt rocking back and forth in bed, pts husband utilized call-light to alert RN that pt is in intense 8/10 right flank pain.

## 2015-10-31 NOTE — ED Notes (Addendum)
According to EMS, pt awoke with right sided flank pain w/ no radiation. Pt also reports n/v. Pt had x3 episodes of emesis with EMS. Pt has hx of Pancreatic CA. Pt arrives to ED, A+OX4, speaking in complete sentences.

## 2015-12-21 ENCOUNTER — Other Ambulatory Visit: Payer: Self-pay

## 2015-12-21 DIAGNOSIS — Z1231 Encounter for screening mammogram for malignant neoplasm of breast: Secondary | ICD-10-CM

## 2015-12-22 ENCOUNTER — Ambulatory Visit
Admission: RE | Admit: 2015-12-22 | Discharge: 2015-12-22 | Disposition: A | Discharge status: Home / Self Care | Payer: Medicare Other | Source: Ambulatory Visit

## 2015-12-22 DIAGNOSIS — Z1231 Encounter for screening mammogram for malignant neoplasm of breast: Secondary | ICD-10-CM

## 2016-06-19 ENCOUNTER — Ambulatory Visit (INDEPENDENT_AMBULATORY_CARE_PROVIDER_SITE_OTHER): Payer: Medicare Other | Admitting: Orthopaedic Surgery

## 2016-06-19 DIAGNOSIS — M79672 Pain in left foot: Secondary | ICD-10-CM | POA: Diagnosis not present

## 2016-08-01 ENCOUNTER — Ambulatory Visit (INDEPENDENT_AMBULATORY_CARE_PROVIDER_SITE_OTHER): Payer: Medicare Other | Admitting: Orthopaedic Surgery

## 2016-08-01 ENCOUNTER — Encounter (INDEPENDENT_AMBULATORY_CARE_PROVIDER_SITE_OTHER): Payer: Self-pay

## 2016-12-17 DEATH — deceased

## 2017-09-25 IMAGING — CT CT ABD-PELV W/ CM
2 of 5 series · 14 of 46 positions shown, 16 images · IV contrast (OMNIPAQUE 300)
Comparison: May 20, 2015

CLINICAL DATA: Right flank pain with nausea and vomiting. History
of pancreatic carcinoma

EXAM:
CT ABDOMEN AND PELVIS WITH CONTRAST
TECHNIQUE: Multidetector CT imaging of the abdomen and pelvis was performed
using the standard protocol following bolus administration of
intravenous contrast. Oral contrast was also administered.
CONTRAST:  50mL OMNIPAQUE IOHEXOL 300 MG/ML SOLN, 100mL OMNIPAQUE
IOHEXOL 300 MG/ML SOLN

[Series 2: abd/pel with · axial · 0.73mm/px · z∈[-445,-75]mm · 11 of 84 slices shown, 13 images]
[im 5/84  soft-tissue]
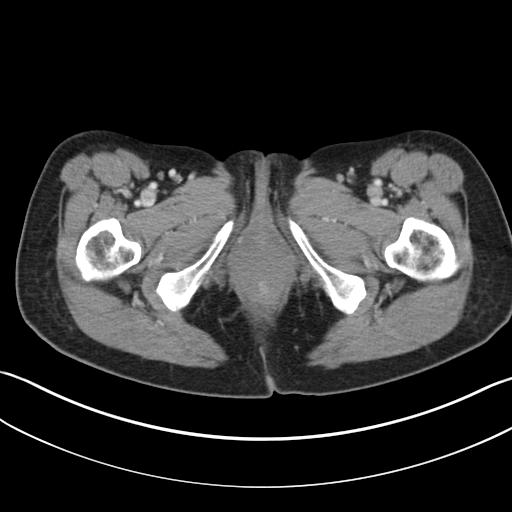
[im 5/84  bone]
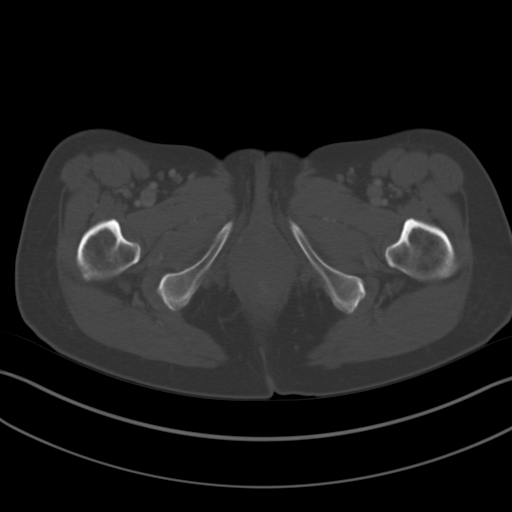
[im 14/84  soft-tissue]
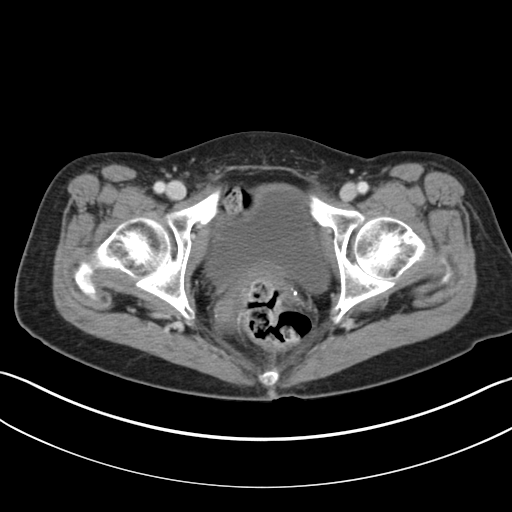
[im 19/84  soft-tissue]
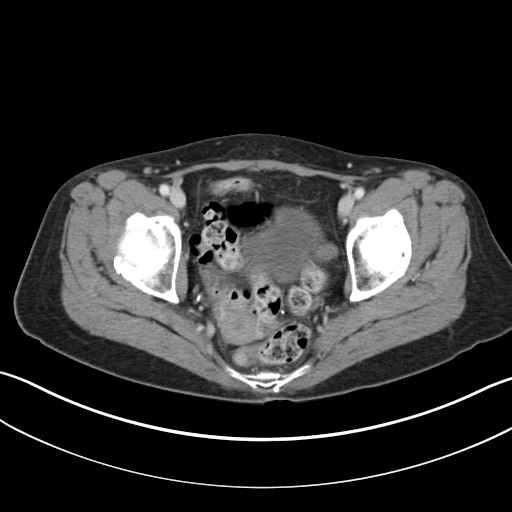
[im 28/84  soft-tissue]
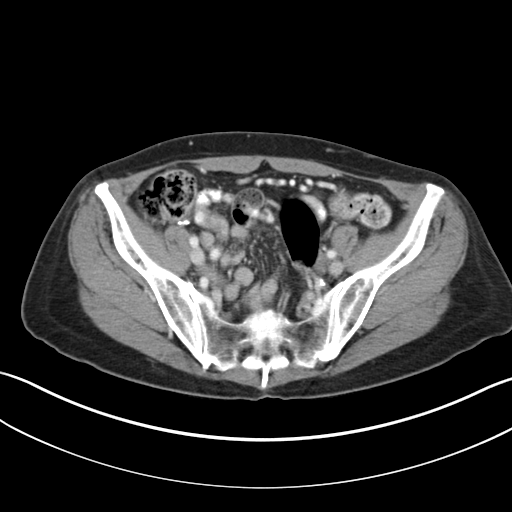
[im 33/84  soft-tissue]
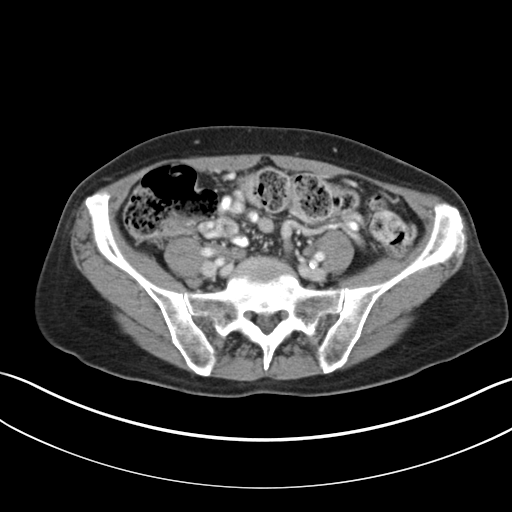
[im 42/84  soft-tissue]
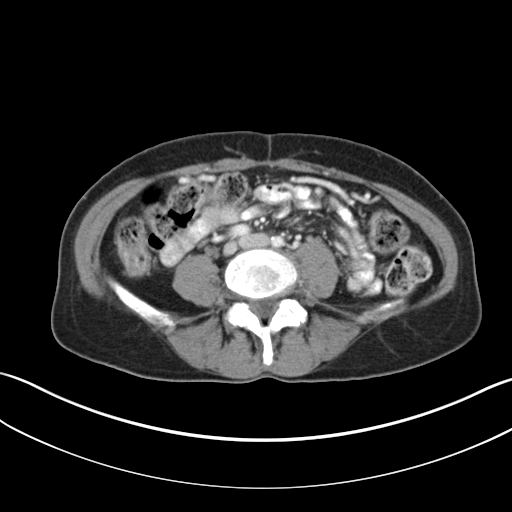
[im 51/84  soft-tissue]
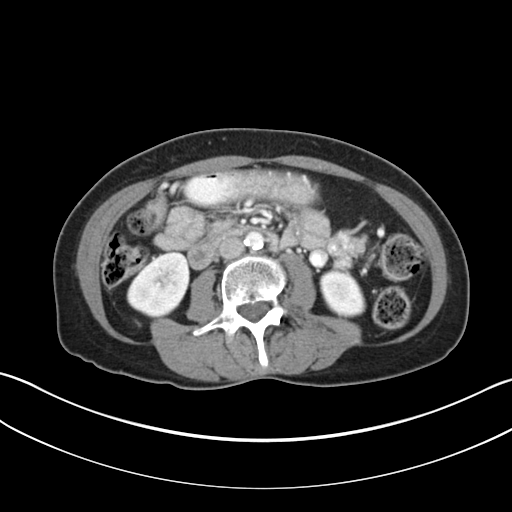
[im 56/84  soft-tissue]
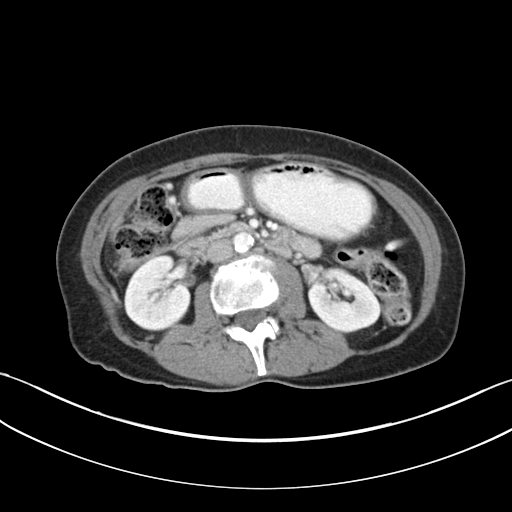
[im 65/84  soft-tissue]
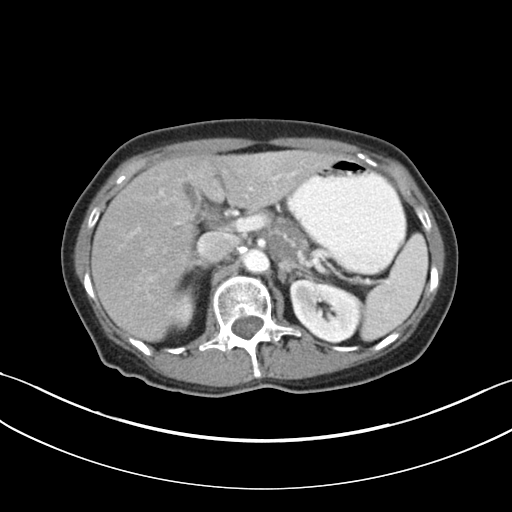
[im 65/84  bone]
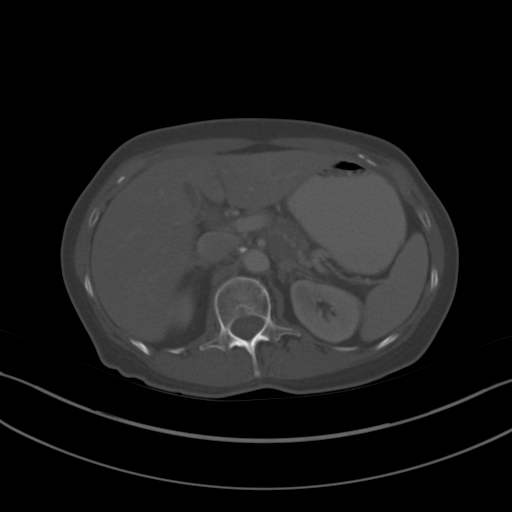
[im 70/84  soft-tissue]
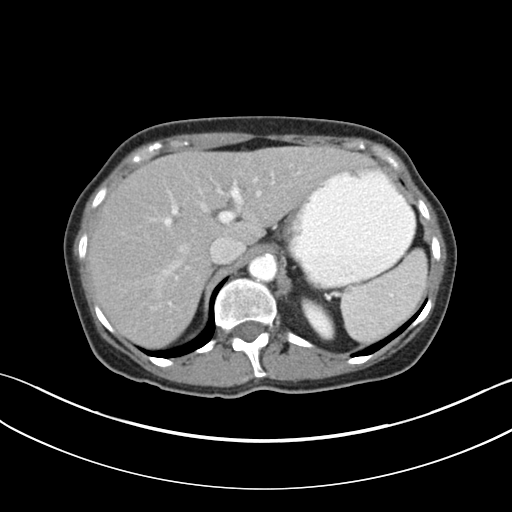
[im 79/84  soft-tissue]
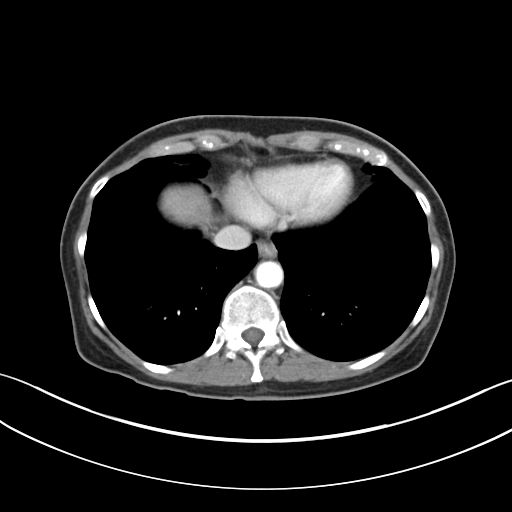

[Series 5: coronal a/|p · coronal · 0.66mm/px · 3 of 65 slices shown]
[im 22/65  soft-tissue]
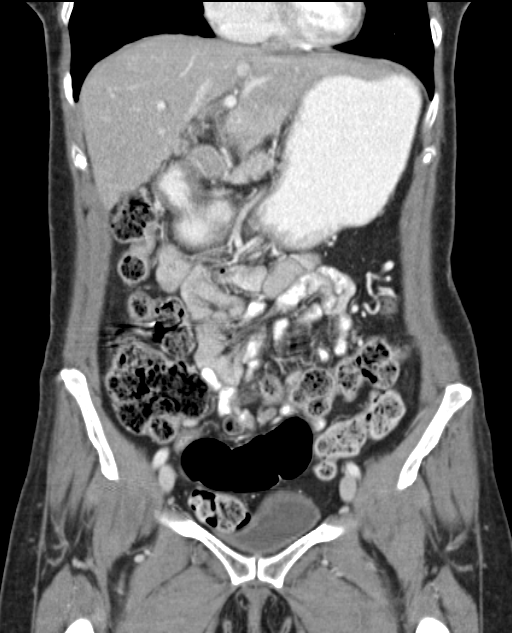
[im 29/65  soft-tissue]
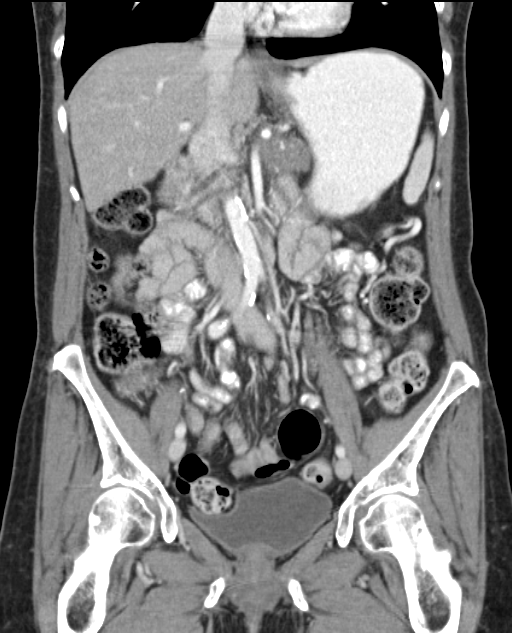
[im 36/65  soft-tissue]
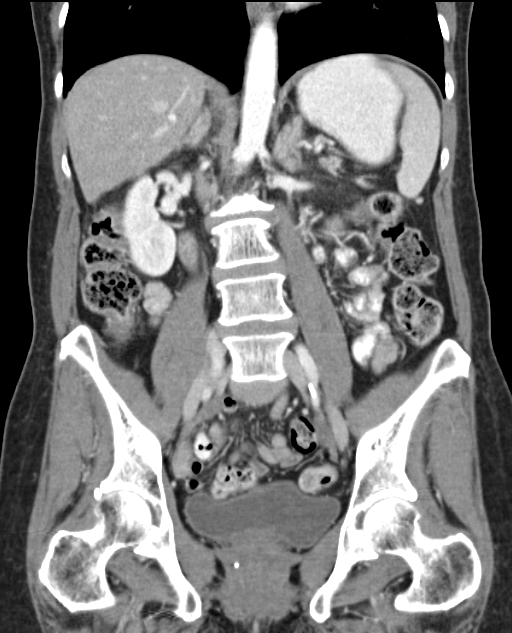

[14 of 46 positions shown; findings below may reference images not displayed]

FINDINGS: Lower chest: There is mild scarring in the anterior right lung base.
Lung bases otherwise are clear.

Hepatobiliary: There is hepatic steatosis. No focal liver lesions
are apparent. Currently the gallbladder is contracted. There is no
appreciable biliary duct dilatation.

Pancreas: The the mass arising at the junction of the body and tail
the pancreas is again noted measuring 3.9 x 2.8 cm, smaller compared
to prior study where it measured 4.9 x 3.7 cm. This apparent
pancreatic neoplasm invades the surrounding soft tissues posteriorly
and abuts the left adrenal and the left lateral aspect of the celiac
axis. This mass largely surrounds but does not occlude the superior
mesenteric artery. The mass abuts does but does not frankly invade
the left adrenal. This appearance is essentially stable compared to
the previous study. This mass encases and narrows the splenic artery
and occludes the splenic vein in the area of the mass, also present
on prior study. There is no pancreatic duct dilatation. No new
pancreatic lesion is apparent.

Spleen: There is a 5 mm probable splenic hemangioma along the
posteromedial aspect of the spleen near the greater curvature the
stomach. No other splenic lesions are identified.

Adrenals/Urinary Tract: No adrenal lesions are identified. Tumor
does not frankly invade the left adrenal. There is no renal mass or
hydronephrosis on either side. No renal or ureteral calculus on
either side. Urinary bladder is midline with wall thickness within
normal limits.

Probable tiny splenic hemangioma. Oted above, the pancreatic mass
abuts but does not frankly invade the left adrenal. Kidneys
bilaterally show no mass or hydronephrosis on either side. There is
no renal or ureteral calculus on either side. Urinary bladder is
midline with wall thickness within normal limits.

Stomach/Bowel: There is fold thickening in the distal gastric antrum
and proximal duodenum wall regions. No well-defined ulceration or
fistula seen in this area by CT. No bowel wall thickening is seen
elsewhere in the abdomen or pelvis. No mesenteric thickening. No
bowel obstruction. No free air or portal venous air.

Vascular/Lymphatic: There is atherosclerotic calcification in the
aorta and iliac arteries without aneurysm. There is moderate
atherosclerotic calcification at the origin of the right renal
artery. Mild calcification is noted at the origins of the celiac and
superior mesenteric arteries. Please see Pancreas section for
details regarding pancreatic tumor and nearby vessels. There is no
demonstrable adenopathy in the abdomen or pelvis.

Reproductive: There is no pelvic mass or pelvic fluid collection.
The uterus is mildly retroverted.

Other: Appendix is absent. No peritoneal or retroperitoneal omental
metastases are appreciable. No abscess or ascites in the abdomen or
pelvis.

Musculoskeletal: There is degenerative change in the lumbar spine.
There are no blastic or lytic bone lesions. There is lumbar
scoliosis.
IMPRESSION: Pancreatic mass arising from the body of the pancreas involving a
portion of the tail the pancreas is slightly smaller compared to
previous study. This mass causes narrowing and irregularity along
the wall of a portion of the splenic artery and hips crux the
splenic vein in the area of the mass. This mass surrounds much of
the superior mesenteric artery and abuts the leftward aspect of the
celiac artery without obstructing these vessels. This mass extends
to abut the adrenal on the left but does not frankly invade the left
adrenal. There is no appreciable pancreatic duct dilatation. No new
pancreatic lesion is identified. No adenopathy.

Thickening in the distal gastric antrum and proximal duodenum.
Suspect distal gastritis and proximal duodenitis. No well-defined
ulceration in these areas.

No bowel obstruction.  No abscess.

Hepatic steatosis. No liver mass identified. Probable tiny splenic
hemangioma. Conceivably, a small splenic metastasis could present in
this manner.

Appendix absent.  No renal or ureteral calculus.  No hydronephrosis.
